# Patient Record
Sex: Female | Born: 1990 | Hispanic: Refuse to answer | Smoking: Never smoker
Health system: Southern US, Community
[De-identification: ages and names within clinical notes are randomized; demographics above are authoritative.]

## PROBLEM LIST (undated history)

## (undated) DIAGNOSIS — K219 Gastro-esophageal reflux disease without esophagitis: Secondary | ICD-10-CM

## (undated) DIAGNOSIS — Z789 Other specified health status: Secondary | ICD-10-CM

## (undated) HISTORY — PX: NO PAST SURGERIES: SHX2092

## (undated) HISTORY — DX: Other specified health status: Z78.9

---

## 2014-07-29 ENCOUNTER — Telehealth: Payer: Self-pay | Admitting: *Deleted

## 2014-07-29 NOTE — Telephone Encounter (Signed)
Patient is pregnant and requesting a NOB appointment. Patient states her LMP is 04-29-14.  Patient is currently [redacted] weeks pregnant. Patient has active Medicaid and has confirmed her pregnancy. Patient has been scheduled to see Orvilla Cornwallachelle Denney, CNM on 08-03-14 @ 2 pm. Patient advised if she has any emergent needs to utilize [facility]. Patient advised of No Show Policy and patient verbalized understanding.

## 2014-08-03 ENCOUNTER — Encounter: Payer: Self-pay | Admitting: Certified Nurse Midwife

## 2014-08-04 ENCOUNTER — Ambulatory Visit (INDEPENDENT_AMBULATORY_CARE_PROVIDER_SITE_OTHER): Payer: Medicaid Other | Admitting: Certified Nurse Midwife

## 2014-08-04 ENCOUNTER — Encounter: Payer: Self-pay | Admitting: Certified Nurse Midwife

## 2014-08-04 VITALS — BP 124/75 | HR 101 | Temp 98.9°F | Ht 63.5 in | Wt 140.0 lb

## 2014-08-04 DIAGNOSIS — Z3400 Encounter for supervision of normal first pregnancy, unspecified trimester: Secondary | ICD-10-CM

## 2014-08-04 DIAGNOSIS — Z3402 Encounter for supervision of normal first pregnancy, second trimester: Secondary | ICD-10-CM

## 2014-08-04 LAB — POCT URINALYSIS DIPSTICK
BILIRUBIN UA: NEGATIVE
GLUCOSE UA: NEGATIVE
Ketones, UA: NEGATIVE
NITRITE UA: NEGATIVE
PH UA: 7
Protein, UA: NEGATIVE
Spec Grav, UA: 1.01
Urobilinogen, UA: NEGATIVE

## 2014-08-04 NOTE — Addendum Note (Signed)
Addended by: Samantha CrimesENNEY, Jalynne Persico ANNE on: 08/04/2014 05:55 PM   Modules accepted: Orders

## 2014-08-04 NOTE — Progress Notes (Signed)
Subjective:    Tara RoughenRachel Vaquera is being seen today for her first obstetrical visit.  This is not a planned pregnancy. She is at 73103w6d gestation. Her obstetrical history is not significant for any risk factors. Relationship with FOB: significant other, not living together. Patient does intend to breast feed. Pregnancy history fully reviewed.  Unsure of LMP, "some time early November". Postpartum contraception discussed, unsure of what method she desires.     The information documented in the HPI was reviewed and verified.  Menstrual History: OB History    Gravida Para Term Preterm AB TAB SAB Ectopic Multiple Living   1               Menarche age: [age]  Patient's last menstrual period was 04/29/2014 (within days).    Past Medical History  Diagnosis Date  . Medical history non-contributory     History reviewed. No pertinent past surgical history.   (Not in a hospital admission) No Known Allergies  History  Substance Use Topics  . Smoking status: Never Smoker   . Smokeless tobacco: Never Used  . Alcohol Use: No    Family History  Problem Relation Age of Onset  . Hypertension Father   . Cancer Maternal Grandmother   . Cancer Maternal Grandfather   . Alzheimer's disease Paternal Grandmother   . Hypertension Paternal Grandfather      Review of Systems Constitutional: negative for weight loss Gastrointestinal: negative for vomiting Genitourinary:negative for genital lesions and vaginal discharge and dysuria Musculoskeletal:negative for back pain Behavioral/Psych: negative for abusive relationship, depression, illegal drug usage and tobacco use    Objective:    BP 124/75 mmHg  Pulse 101  Temp(Src) 98.9 F (37.2 C)  Ht 5' 3.5" (1.613 m)  Wt 63.504 kg (140 lb)  BMI 24.41 kg/m2  LMP 04/29/2014 (Within Days) General Appearance:    Alert, cooperative, no distress, appears stated age  Head:    Normocephalic, without obvious abnormality, atraumatic  Eyes:    PERRL,  conjunctiva/corneas clear, EOM's intact, fundi    benign, both eyes  Ears:    Normal TM's and external ear canals, both ears  Nose:   Nares normal, septum midline, mucosa normal, no drainage    or sinus tenderness  Throat:   Lips, mucosa, and tongue normal; teeth and gums normal  Neck:   Supple, symmetrical, trachea midline, no adenopathy;    thyroid:  no enlargement/tenderness/nodules; no carotid   bruit or JVD  Back:     Symmetric, no curvature, ROM normal, no CVA tenderness  Lungs:     Clear to auscultation bilaterally, respirations unlabored  Chest Wall:    No tenderness or deformity   Heart:    Regular rate and rhythm, S1 and S2 normal, no murmur, rub   or gallop  Breast Exam:    No tenderness, masses, or nipple abnormality  Abdomen:     Soft, non-tender, bowel sounds active all four quadrants,    no masses, no organomegaly  Genitalia:    Pelvic: cervix normal in appearance, external genitalia normal, no adnexal masses or tenderness, uterus about 14 week size, shape, and consistency and vagina normal white thick cottage cheese like discharge  Present.    Extremities:   Extremities normal, atraumatic, no cyanosis or edema  Pulses:   2+ and symmetric all extremities  Skin:   Skin color, texture, turgor normal, no rashes or lesions  Lymph nodes:   Cervical, supraclavicular, and axillary nodes normal  Neurologic:   CNII-XII  intact, normal strength, sensation and reflexes    throughout      Lab Review Urine pregnancy test Labs reviewed yes Radiologic studies reviewed not applicable  Assessment:    Pregnancy at [redacted]w[redacted]d weeks    Plan:    U/S ordered for confirmation of dates.    Prenatal vitamins.  Counseling provided regarding continued use of seat belts, cessation of alcohol consumption, smoking or use of illicit drugs; infection precautions i.e., influenza/TDAP immunizations, toxoplasmosis,CMV, parvovirus, listeria and varicella; workplace safety, exercise during pregnancy;  routine dental care, safe medications, sexual activity, hot tubs, saunas, pools, travel, caffeine use, fish and methlymercury, potential toxins, hair treatments, varicose veins Weight gain recommendations per IOM guidelines reviewed: underweight/BMI< 18.5--> gain 28 - 40 lbs; normal weight/BMI 18.5 - 24.9--> gain 25 - 35 lbs; overweight/BMI 25 - 29.9--> gain 15 - 25 lbs; obese/BMI >30->gain  11 - 20 lbs Problem list reviewed and updated. FIRST/CF mutation testing/NIPT/QUAD SCREEN/fragile X/Ashkenazi Jewish population testing/Spinal muscular atrophy discussed: requested. Role of ultrasound in pregnancy discussed; fetal survey: requested. Amniocentesis discussed: not indicated. VBAC calculator score: VBAC consent form provided Meds ordered this encounter  Medications  . Prenatal Multivit-Min-Fe-FA (PRENATAL VITAMINS PO)    Sig: Take 1 tablet by mouth.   Orders Placed This Encounter  Procedures  . Culture, OB Urine  . Obstetric panel  . HIV antibody  . Hemoglobinopathy evaluation  . Varicella zoster antibody, IgG  . Vit D  25 hydroxy (rtn osteoporosis monitoring)  . POCT urinalysis dipstick    Follow up in 4 weeks. 50% of 30 min visit spent on counseling and coordination of care.

## 2014-08-05 ENCOUNTER — Other Ambulatory Visit: Payer: Self-pay | Admitting: Certified Nurse Midwife

## 2014-08-05 DIAGNOSIS — O3680X1 Pregnancy with inconclusive fetal viability, fetus 1: Secondary | ICD-10-CM

## 2014-08-05 DIAGNOSIS — Z3687 Encounter for antenatal screening for uncertain dates: Secondary | ICD-10-CM

## 2014-08-05 LAB — PAP IG W/ RFLX HPV ASCU

## 2014-08-05 LAB — OBSTETRIC PANEL
ANTIBODY SCREEN: NEGATIVE
BASOS ABS: 0 10*3/uL (ref 0.0–0.1)
Basophils Relative: 0 % (ref 0–1)
Eosinophils Absolute: 0 10*3/uL (ref 0.0–0.7)
Eosinophils Relative: 0 % (ref 0–5)
HCT: 36.2 % (ref 36.0–46.0)
HEMOGLOBIN: 11.9 g/dL — AB (ref 12.0–15.0)
HEP B S AG: NEGATIVE
LYMPHS ABS: 1.8 10*3/uL (ref 0.7–4.0)
LYMPHS PCT: 15 % (ref 12–46)
MCH: 27.5 pg (ref 26.0–34.0)
MCHC: 32.9 g/dL (ref 30.0–36.0)
MCV: 83.6 fL (ref 78.0–100.0)
MONO ABS: 0.6 10*3/uL (ref 0.1–1.0)
MPV: 8.8 fL (ref 8.6–12.4)
Monocytes Relative: 5 % (ref 3–12)
NEUTROS PCT: 80 % — AB (ref 43–77)
Neutro Abs: 9.7 10*3/uL — ABNORMAL HIGH (ref 1.7–7.7)
Platelets: 398 10*3/uL (ref 150–400)
RBC: 4.33 MIL/uL (ref 3.87–5.11)
RDW: 17.3 % — ABNORMAL HIGH (ref 11.5–15.5)
RUBELLA: 12.7 {index} — AB (ref <0.90)
Rh Type: POSITIVE
WBC: 12.1 10*3/uL — ABNORMAL HIGH (ref 4.0–10.5)

## 2014-08-05 LAB — HIV ANTIBODY (ROUTINE TESTING W REFLEX): HIV 1&2 Ab, 4th Generation: NONREACTIVE

## 2014-08-05 LAB — VARICELLA ZOSTER ANTIBODY, IGG: Varicella IgG: 1845 Index — ABNORMAL HIGH (ref <135.00)

## 2014-08-05 LAB — VITAMIN D 25 HYDROXY (VIT D DEFICIENCY, FRACTURES): VIT D 25 HYDROXY: 17 ng/mL — AB (ref 30–100)

## 2014-08-06 LAB — CULTURE, OB URINE
Colony Count: NO GROWTH
ORGANISM ID, BACTERIA: NO GROWTH

## 2014-08-06 LAB — HEMOGLOBINOPATHY EVALUATION
HEMOGLOBIN OTHER: 0 %
HGB A2 QUANT: 2.3 % (ref 2.2–3.2)
HGB S QUANTITAION: 0 %
Hgb A: 97.3 % (ref 96.8–97.8)
Hgb F Quant: 0.4 % (ref 0.0–2.0)

## 2014-08-08 LAB — SURESWAB, VAGINOSIS/VAGINITIS PLUS
Atopobium vaginae: DETECTED Log (cells/mL)
BV CATEGORY: UNDETERMINED — AB
C. GLABRATA, DNA: NOT DETECTED
C. TRACHOMATIS RNA, TMA: DETECTED — AB
C. albicans, DNA: DETECTED — AB
C. parapsilosis, DNA: NOT DETECTED
C. tropicalis, DNA: NOT DETECTED
GARDNERELLA VAGINALIS: 7.8 Log (cells/mL)
LACTOBACILLUS SPECIES: 7.1 Log (cells/mL)
MEGASPHAERA SPECIES: NOT DETECTED Log (cells/mL)
N. gonorrhoeae RNA, TMA: NOT DETECTED
T. vaginalis RNA, QL TMA: NOT DETECTED

## 2014-08-10 ENCOUNTER — Other Ambulatory Visit: Payer: Self-pay | Admitting: *Deleted

## 2014-08-10 DIAGNOSIS — Z3400 Encounter for supervision of normal first pregnancy, unspecified trimester: Secondary | ICD-10-CM

## 2014-08-10 MED ORDER — OB COMPLETE PETITE 35-5-1-200 MG PO CAPS: 1.0000 | ORAL_CAPSULE | Freq: Every day | ORAL | Status: DC

## 2014-08-10 MED ORDER — TERCONAZOLE 0.4 % VA CREA
1.0000 | TOPICAL_CREAM | Freq: Every day | VAGINAL | Status: DC
Start: 2014-08-10 — End: 2014-09-01

## 2014-08-10 NOTE — Progress Notes (Signed)
Medications sent to pharmacy for low Vitamin D and yeast infection.  See lab note.

## 2014-08-12 ENCOUNTER — Ambulatory Visit (INDEPENDENT_AMBULATORY_CARE_PROVIDER_SITE_OTHER): Payer: Medicaid Other

## 2014-08-12 ENCOUNTER — Other Ambulatory Visit: Payer: Self-pay | Admitting: Certified Nurse Midwife

## 2014-08-12 DIAGNOSIS — Z36 Encounter for antenatal screening of mother: Secondary | ICD-10-CM

## 2014-08-12 DIAGNOSIS — Z3687 Encounter for antenatal screening for uncertain dates: Secondary | ICD-10-CM

## 2014-08-12 LAB — US OB COMP + 14 WK

## 2014-08-14 ENCOUNTER — Telehealth: Payer: Self-pay

## 2014-08-14 NOTE — Telephone Encounter (Signed)
Called patient's insurance [facility], they did say she did not have coverage, however this was just the prescription side;  they did not have the medical side phone number - tried to contact patient to get number on her card - called [place] [employer] medicaid at [phone] and they said patient has to get a letter of denial from her insurance and give to caseworker, so they can take it out of [place] [employer] - explained this to patient and she said she would take care of it. Ref # [id number] [facility]

## 2014-08-18 ENCOUNTER — Encounter: Payer: Self-pay | Admitting: *Deleted

## 2014-09-01 ENCOUNTER — Ambulatory Visit (INDEPENDENT_AMBULATORY_CARE_PROVIDER_SITE_OTHER): Payer: Medicaid Other | Admitting: Certified Nurse Midwife

## 2014-09-01 VITALS — BP 116/77 | HR 91 | Temp 98.4°F | Wt 144.0 lb

## 2014-09-01 DIAGNOSIS — N760 Acute vaginitis: Secondary | ICD-10-CM

## 2014-09-01 DIAGNOSIS — A5609 Other chlamydial infection of lower genitourinary tract: Secondary | ICD-10-CM

## 2014-09-01 DIAGNOSIS — Z3402 Encounter for supervision of normal first pregnancy, second trimester: Secondary | ICD-10-CM

## 2014-09-01 DIAGNOSIS — A499 Bacterial infection, unspecified: Secondary | ICD-10-CM

## 2014-09-01 DIAGNOSIS — A749 Chlamydial infection, unspecified: Secondary | ICD-10-CM

## 2014-09-01 DIAGNOSIS — O98312 Other infections with a predominantly sexual mode of transmission complicating pregnancy, second trimester: Secondary | ICD-10-CM

## 2014-09-01 DIAGNOSIS — B9689 Other specified bacterial agents as the cause of diseases classified elsewhere: Secondary | ICD-10-CM

## 2014-09-01 DIAGNOSIS — O98812 Other maternal infectious and parasitic diseases complicating pregnancy, second trimester: Secondary | ICD-10-CM

## 2014-09-01 LAB — POCT URINALYSIS DIPSTICK
Bilirubin, UA: NEGATIVE
GLUCOSE UA: NEGATIVE
KETONES UA: NEGATIVE
Leukocytes, UA: NEGATIVE
Nitrite, UA: NEGATIVE
Protein, UA: NEGATIVE
RBC UA: NEGATIVE
Urobilinogen, UA: NEGATIVE
pH, UA: 8.5

## 2014-09-01 MED ORDER — AZITHROMYCIN 250 MG PO TABS: ORAL_TABLET | ORAL | Status: AC

## 2014-09-01 MED ORDER — METRONIDAZOLE 0.75 % VA GEL: 1.0000 | Freq: Every day | VAGINAL | Status: AC

## 2014-09-01 NOTE — Progress Notes (Signed)
Subjective:    Tara RoughenRachel Strickland is a 24 y.o. female being seen today for her obstetrical visit. She is at 6866w2d gestation. Patient reports: no complaints . Fetal movement: normal.  Discussed importance of treatment of Chlamydia, abstinence until treatment for both her and her partner. Patient verbalized understanding.  Discussed OGTT after next visit. U/S ordered for fetal anatomy scan.  Discussed S/S PTL.   Problem List Items Addressed This Visit      Genitourinary   Chlamydial cervicitis   Bacterial vaginosis    Other Visit Diagnoses    Encounter for supervision of normal first pregnancy in second trimester    -  Primary    Relevant Orders    POCT urinalysis dipstick (Completed)    US OB Detail + 14 Wk    BV (bacterial vaginosis)        Relevant Medications    metroNIDAZOLE (METROGEL VAGINAL) 0.75 % vaginal gel    Chlamydia infection affecting pregnancy in second trimester          Patient Active Problem List   Diagnosis Date Noted  . Chlamydial cervicitis 09/02/2014  . Bacterial vaginosis 09/02/2014  . Normal first pregnancy confirmed, antepartum 08/04/2014   Objective:    BP 116/77 mmHg  Pulse 91  Temp(Src) 98.4 F (36.9 C)  Wt 65.318 kg (144 lb)  LMP 04/29/2014 (Within Days) FHT: 153 BPM  Uterine Size: size equals dates and at U.      Assessment:    Pregnancy @ 3566w2d    Plan:    OBGCT: discussed. Signs and symptoms of preterm labor: discussed and handout given. Medications ordered and patient educated on how to take them.   Labs, problem list reviewed and updated 2 hr GTT planned Follow up in 4 weeks.

## 2014-09-01 NOTE — Patient Instructions (Addendum)
Preterm Labor Information Preterm labor is when labor starts at less than 37 weeks of pregnancy. The normal length of a pregnancy is 39 to 41 weeks. CAUSES Often, there is no identifiable underlying cause as to why a woman goes into preterm labor. One of the most common known causes of preterm labor is infection. Infections of the uterus, cervix, vagina, amniotic sac, bladder, kidney, or even the lungs (pneumonia) can cause labor to start. Other suspected causes of preterm labor include:   Urogenital infections, such as yeast infections and bacterial vaginosis.   Uterine abnormalities (uterine shape, uterine septum, fibroids, or bleeding from the placenta).   A cervix that has been operated on (it may fail to stay closed).   Malformations in the fetus.   Multiple gestations (twins, triplets, and so on).   Breakage of the amniotic sac.  RISK FACTORS  Having a previous history of preterm labor.   Having premature rupture of membranes (PROM).   Having a placenta that covers the opening of the cervix (placenta previa).   Having a placenta that separates from the uterus (placental abruption).   Having a cervix that is too weak to hold the fetus in the uterus (incompetent cervix).   Having too much fluid in the amniotic sac (polyhydramnios).   Taking illegal drugs or smoking while pregnant.   Not gaining enough weight while pregnant.   Being younger than 82 and older than 22 years old.   Having a low socioeconomic status.   Being African American. SYMPTOMS Signs and symptoms of preterm labor include:   Menstrual-like cramps, abdominal pain, or back pain.  Uterine contractions that are regular, as frequent as six in an hour, regardless of their intensity (may be mild or painful).  Contractions that start on the top of the uterus and spread down to the lower abdomen and back.   A sense of increased pelvic pressure.   A watery or bloody mucus discharge  that comes from the vagina.  TREATMENT Depending on the length of the pregnancy and other circumstances, your health care provider may suggest bed rest. If necessary, there are medicines that can be given to stop contractions and to mature the fetal lungs. If labor happens before 34 weeks of pregnancy, a prolonged hospital stay may be recommended. Treatment depends on the condition of both you and the fetus.  WHAT SHOULD YOU DO IF YOU THINK YOU ARE IN PRETERM LABOR? Call your health care provider right away. You will need to go to the hospital to get checked immediately. HOW CAN YOU PREVENT PRETERM LABOR IN FUTURE PREGNANCIES? You should:   Stop smoking if you smoke.  Maintain healthy weight gain and avoid chemicals and drugs that are not necessary.  Be watchful for any type of infection.  Inform your health care provider if you have a known history of preterm labor. Document Released: 08/26/2003 Document Revised: 02/05/2013 Document Reviewed: 07/08/2012 [facility] Patient Information 2015 [place], [place]. This information is not intended to replace advice given to you by your health care provider. Make sure you discuss any questions you have with your health care provider. Preterm Labor Information Preterm labor is when labor starts at less than 37 weeks of pregnancy. The normal length of a pregnancy is 39 to 41 weeks. CAUSES Often, there is no identifiable underlying cause as to why a woman goes into preterm labor. One of the most common known causes of preterm labor is infection. Infections of the uterus, cervix, vagina, amniotic sac, bladder,  kidney, or even the lungs (pneumonia) can cause labor to start. Other suspected causes of preterm labor include:   Urogenital infections, such as yeast infections and bacterial vaginosis.   Uterine abnormalities (uterine shape, uterine septum, fibroids, or bleeding from the placenta).   A cervix that has been operated on (it may fail to stay  closed).   Malformations in the fetus.   Multiple gestations (twins, triplets, and so on).   Breakage of the amniotic sac.  RISK FACTORS  Having a previous history of preterm labor.   Having premature rupture of membranes (PROM).   Having a placenta that covers the opening of the cervix (placenta previa).   Having a placenta that separates from the uterus (placental abruption).   Having a cervix that is too weak to hold the fetus in the uterus (incompetent cervix).   Having too much fluid in the amniotic sac (polyhydramnios).   Taking illegal drugs or smoking while pregnant.   Not gaining enough weight while pregnant.   Being younger than 69 and older than 22 years old.   Having a low socioeconomic status.   Being African American. SYMPTOMS Signs and symptoms of preterm labor include:   Menstrual-like cramps, abdominal pain, or back pain.  Uterine contractions that are regular, as frequent as six in an hour, regardless of their intensity (may be mild or painful).  Contractions that start on the top of the uterus and spread down to the lower abdomen and back.   A sense of increased pelvic pressure.   A watery or bloody mucus discharge that comes from the vagina.  TREATMENT Depending on the length of the pregnancy and other circumstances, your health care provider may suggest bed rest. If necessary, there are medicines that can be given to stop contractions and to mature the fetal lungs. If labor happens before 34 weeks of pregnancy, a prolonged hospital stay may be recommended. Treatment depends on the condition of both you and the fetus.  WHAT SHOULD YOU DO IF YOU THINK YOU ARE IN PRETERM LABOR? Call your health care provider right away. You will need to go to the hospital to get checked immediately. HOW CAN YOU PREVENT PRETERM LABOR IN FUTURE PREGNANCIES? You should:   Stop smoking if you smoke.  Maintain healthy weight gain and avoid chemicals  and drugs that are not necessary.  Be watchful for any type of infection.  Inform your health care provider if you have a known history of preterm labor. Document Released: 08/26/2003 Document Revised: 02/05/2013 Document Reviewed: 07/08/2012 [facility] Patient Information 2015 [place], [place]. This information is not intended to replace advice given to you by your health care provider. Make sure you discuss any questions you have with your health care provider. Chlamydia Chlamydia is an infection. It is spread through sexual contact. Chlamydia can be in different areas of the body. These areas include the cervix, urethra, throat, or rectum. You may not know you have chlamydia because many people never develop the symptoms. Chlamydia is not difficult to treat once you know you have it. However, if it is left untreated, chlamydia can lead to more serious health problems.  CAUSES  Chlamydia is caused by bacteria. It is a sexually transmitted disease. It is passed from an infected partner during intimate contact. This contact could be with the genitals, mouth, or rectal area. Chlamydia can also be passed from mothers to babies during birth. SIGNS AND SYMPTOMS  There may not be any symptoms. This is often the  case early in the infection. If symptoms develop, they may include:  Mild pain and discomfort when urinating.  Redness, soreness, and swelling (inflammation) of the rectum.  Vaginal discharge.  Painful intercourse.  Abdominal pain.  Bleeding between menstrual periods. DIAGNOSIS  To diagnose this infection, your health care provider will do a pelvic exam. Cultures will be taken of the vagina, cervix, urine, and possibly the rectum to verify the diagnosis.  TREATMENT You will be given antibiotic medicines. If you are pregnant, certain types of antibiotics will need to be avoided. Any sexual partners should also be treated, even if they do not show symptoms.  HOME CARE INSTRUCTIONS   Take  your antibiotic medicine as directed by your health care provider. Finish the antibiotic even if you start to feel better.  Take medicines only as directed by your health care provider.  Inform any sexual partners about the infection. They should also be treated.  Do not have sexual contact until your health care provider tells you it is okay.  Get plenty of rest.  Eat a well-balanced diet.  Drink enough fluids to keep your urine clear or pale yellow.  Keep all follow-up visits as directed by your health care provider. SEEK MEDICAL CARE IF:  You have painful urination.  You have abdominal pain.  You have vaginal discharge.  You have painful sexual intercourse.  You have bleeding between periods and after sex.  You have a fever. SEEK IMMEDIATE MEDICAL CARE IF:   You experience nausea or vomiting.  You experience excessive sweating (diaphoresis).  You have difficulty swallowing. MAKE SURE YOU:   Understand these instructions.  Will watch your condition.  Will get help right away if you are not doing well or get worse. Document Released: 03/15/2005 Document Revised: 10/20/2013 Document Reviewed: 02/10/2013 [facility] Patient Information 2015 [place], [place]. This information is not intended to replace advice given to you by your health care provider. Make sure you discuss any questions you have with your health care provider. Bacterial Vaginosis Bacterial vaginosis is an infection of the vagina. It happens when too many of certain germs (bacteria) grow in the vagina. HOME CARE  Take your medicine as told by your doctor.  Finish your medicine even if you start to feel better.  Do not have sex until you finish your medicine and are better.  Tell your sex partner that you have an infection. They should see their doctor for treatment.  Practice safe sex. Use condoms. Have only one sex partner. GET HELP IF:  You are not getting better after 3 days of treatment.  You  have more grey fluid (discharge) coming from your vagina than before.  You have more pain than before.  You have a fever. MAKE SURE YOU:   Understand these instructions.  Will watch your condition.  Will get help right away if you are not doing well or get worse. Document Released: 03/14/2008 Document Revised: 03/26/2013 Document Reviewed: 01/15/2013 [facility] Patient Information 2015 [place], [place]. This information is not intended to replace advice given to you by your health care provider. Make sure you discuss any questions you have with your health care provider.

## 2014-09-02 ENCOUNTER — Other Ambulatory Visit: Payer: Self-pay | Admitting: Certified Nurse Midwife

## 2014-09-02 DIAGNOSIS — B9689 Other specified bacterial agents as the cause of diseases classified elsewhere: Secondary | ICD-10-CM | POA: Insufficient documentation

## 2014-09-02 DIAGNOSIS — N760 Acute vaginitis: Secondary | ICD-10-CM

## 2014-09-02 DIAGNOSIS — Z1389 Encounter for screening for other disorder: Secondary | ICD-10-CM

## 2014-09-02 DIAGNOSIS — A5609 Other chlamydial infection of lower genitourinary tract: Secondary | ICD-10-CM | POA: Insufficient documentation

## 2014-09-04 LAB — AFP, QUAD SCREEN
AFP: 57.9 ng/mL
Age Alone: 1:1140 {titer}
Curr Gest Age: 16.5 wks.days
HCG, Total: 6.81 IU/mL
INH: 108.7 pg/mL
INTERPRETATION-AFP: NEGATIVE
MOM FOR HCG: 0.19
MOM FOR INH: 0.61
MoM for AFP: 1.56
OPEN SPINA BIFIDA: NEGATIVE
Osb Risk: 1:2340 {titer}
Tri 18 Scr Risk Est: NEGATIVE
Trisomy 18 (Edward) Syndrome Interp.: 1:3060 {titer}
UE3 MOM: 1.74
uE3 Value: 1.72 ng/mL

## 2014-09-08 ENCOUNTER — Telehealth: Payer: Self-pay

## 2014-09-08 ENCOUNTER — Other Ambulatory Visit: Payer: Medicaid Other

## 2014-09-08 NOTE — Telephone Encounter (Signed)
DUE TO THE FACT [place] [employer] IS SHOWING PATIENT HAS ANOTHER INSURANCE, BESIDES MEDICAID, WE WERE NOT ABLE TO GET AUTH FOR [facility] - [facility]

## 2014-09-17 ENCOUNTER — Ambulatory Visit (HOSPITAL_COMMUNITY)
Admission: RE | Admit: 2014-09-17 | Discharge: 2014-09-17 | Disposition: A | Discharge status: Home / Self Care | Payer: Medicaid Other | Source: Ambulatory Visit | Attending: Certified Nurse Midwife | Admitting: Certified Nurse Midwife

## 2014-09-17 ENCOUNTER — Ambulatory Visit (HOSPITAL_COMMUNITY): Payer: Self-pay

## 2014-09-17 ENCOUNTER — Telehealth: Payer: Self-pay | Admitting: *Deleted

## 2014-09-17 DIAGNOSIS — Z36 Encounter for antenatal screening of mother: Secondary | ICD-10-CM | POA: Diagnosis present

## 2014-09-17 DIAGNOSIS — Z3A22 22 weeks gestation of pregnancy: Secondary | ICD-10-CM | POA: Diagnosis not present

## 2014-09-17 DIAGNOSIS — Z1389 Encounter for screening for other disorder: Secondary | ICD-10-CM | POA: Insufficient documentation

## 2014-09-17 NOTE — Telephone Encounter (Signed)
Call from Health dept for verification of treatment. 3/31 11:00 LM on VM - patient in office and treated with 1 gram dose Azithromycin on 3/15.

## 2014-09-22 ENCOUNTER — Other Ambulatory Visit: Payer: Medicaid Other

## 2014-09-29 ENCOUNTER — Ambulatory Visit (INDEPENDENT_AMBULATORY_CARE_PROVIDER_SITE_OTHER): Payer: Medicaid Other | Admitting: Certified Nurse Midwife

## 2014-09-29 ENCOUNTER — Encounter: Payer: Self-pay | Admitting: Certified Nurse Midwife

## 2014-09-29 VITALS — BP 114/74 | HR 93 | Temp 98.8°F | Wt 151.0 lb

## 2014-09-29 DIAGNOSIS — Z3402 Encounter for supervision of normal first pregnancy, second trimester: Secondary | ICD-10-CM

## 2014-09-29 LAB — POCT URINALYSIS DIPSTICK
Blood, UA: NEGATIVE
Glucose, UA: NEGATIVE
Ketones, UA: NEGATIVE
Leukocytes, UA: NEGATIVE
NITRITE UA: NEGATIVE
PH UA: 8
PROTEIN UA: NEGATIVE
Spec Grav, UA: <=1.005

## 2014-09-29 NOTE — Patient Instructions (Signed)
Preterm Labor Information Preterm labor is when labor starts at less than 37 weeks of pregnancy. The normal length of a pregnancy is 39 to 41 weeks. CAUSES Often, there is no identifiable underlying cause as to why a woman goes into preterm labor. One of the most common known causes of preterm labor is infection. Infections of the uterus, cervix, vagina, amniotic sac, bladder, kidney, or even the lungs (pneumonia) can cause labor to start. Other suspected causes of preterm labor include:   Urogenital infections, such as yeast infections and bacterial vaginosis.   Uterine abnormalities (uterine shape, uterine septum, fibroids, or bleeding from the placenta).   A cervix that has been operated on (it may fail to stay closed).   Malformations in the fetus.   Multiple gestations (twins, triplets, and so on).   Breakage of the amniotic sac.  RISK FACTORS  Having a previous history of preterm labor.   Having premature rupture of membranes (PROM).   Having a placenta that covers the opening of the cervix (placenta previa).   Having a placenta that separates from the uterus (placental abruption).   Having a cervix that is too weak to hold the fetus in the uterus (incompetent cervix).   Having too much fluid in the amniotic sac (polyhydramnios).   Taking illegal drugs or smoking while pregnant.   Not gaining enough weight while pregnant.   Being younger than 65 and older than 22 years old.   Having a low socioeconomic status.   Being African American. SYMPTOMS Signs and symptoms of preterm labor include:   Menstrual-like cramps, abdominal pain, or back pain.  Uterine contractions that are regular, as frequent as six in an hour, regardless of their intensity (may be mild or painful).  Contractions that start on the top of the uterus and spread down to the lower abdomen and back.   A sense of increased pelvic pressure.   A watery or bloody mucus discharge that  comes from the vagina.  TREATMENT Depending on the length of the pregnancy and other circumstances, your health care provider may suggest bed rest. If necessary, there are medicines that can be given to stop contractions and to mature the fetal lungs. If labor happens before 34 weeks of pregnancy, a prolonged hospital stay may be recommended. Treatment depends on the condition of both you and the fetus.  WHAT SHOULD YOU DO IF YOU THINK YOU ARE IN PRETERM LABOR? Call your health care provider right away. You will need to go to the hospital to get checked immediately. HOW CAN YOU PREVENT PRETERM LABOR IN FUTURE PREGNANCIES? You should:   Stop smoking if you smoke.  Maintain healthy weight gain and avoid chemicals and drugs that are not necessary.  Be watchful for any type of infection.  Inform your health care provider if you have a known history of preterm labor. Document Released: 08/26/2003 Document Revised: 02/05/2013 Document Reviewed: 07/08/2012 [facility] Patient Information 2015 [place], [place]. This information is not intended to replace advice given to you by your health care provider. Make sure you discuss any questions you have with your health care provider. Thinking About Doren Custard???  You must attend a Doren Custard class at [facility]  3rd Wednesday of every month from 7-9pm  [employer] by calling [phone] or online at [email or web address]  Bring [place] the certificate from the class   Waterbirth supplies needed for [facility]/[place]/Health Department patients:  Our practice has a Heritage manager in a Box tub at the hospital  that you can borrow  You will need to purchase an accessory kit that has all needed supplies through [place] [phone]) or online $175.00  Or you can purchase the supplies separately: o Single-use disposable tub liner for Birth Pool in a Box (REGULAR size) o New garden hose labeled "lead-free",  "suitable for drinking water", "non-toxic" OR "water potable" o Garden hose to remove the dirty water o Fish net o Bathing suit top (optional) o Long-handled mirror (optional)  [employer] sells tubs for ~ $120 if you would rather purchase your own tub  [employer] (www.thelaborladies.com) $275 for tub rental/set-up & take down/kit    Things that would prevent you from having a waterbirth:  Premature, <37wks  Previous cesarean birth  Presence of thick meconium-stained fluid  Multiple gestation (Twins, triplets, etc.)  Uncontrolled diabetes  Hypertension  Heavy vaginal bleeding  Non-reassuring fetal heart rate  Active infection (MRSA, etc.)  If your labor has to be induced and induction method requires continuous monitoring of the baby's heart rate  Other risks/issues identified by your obstetrical provider

## 2014-09-29 NOTE — Progress Notes (Signed)
Subjective:    Tara RoughenRachel Strickland is a 24 y.o. female being seen today for her obstetrical visit. She is at 4034w0d gestation. Patient reports: no complaints . Fetal movement: normal.  Problem List Items Addressed This Visit    None    Visit Diagnoses    Supervision of normal first pregnancy in second trimester    -  Primary    Relevant Orders    POCT urinalysis dipstick (Completed)      Patient Active Problem List   Diagnosis Date Noted  . Encounter for routine screening for malformation using ultrasonics   . [redacted] weeks gestation of pregnancy   . Chlamydial cervicitis 09/02/2014  . Bacterial vaginosis 09/02/2014  . Normal first pregnancy confirmed, antepartum 08/04/2014   Objective:    BP 114/74 mmHg  Pulse 93  Temp(Src) 98.8 F (37.1 C)  Wt 68.493 kg (151 lb)  LMP 04/29/2014 (Within Days) FHT: 150's BPM  Uterine Size: 24 cm and size equals dates     Assessment:    Pregnancy @ 1934w0d    Doing well.   Plan:    OBGCT: ordered for next visit. Signs and symptoms of preterm labor: discussed. Considering waterbirth, information given  Labs, problem list reviewed and updated 2 hr GTT planned Follow up in 4 weeks with OGTT.

## 2014-09-29 NOTE — Progress Notes (Signed)
Patient reports she is doing well today. 

## 2014-09-29 NOTE — Addendum Note (Signed)
Addended by: Marya LandryFOSTER, Donnita Farina D on: 09/29/2014 01:48 PM   Modules accepted: Orders

## 2014-10-02 LAB — SURESWAB, VAGINOSIS/VAGINITIS PLUS
Atopobium vaginae: NOT DETECTED Log (cells/mL)
C. PARAPSILOSIS, DNA: NOT DETECTED
C. TROPICALIS, DNA: NOT DETECTED
C. albicans, DNA: NOT DETECTED
C. glabrata, DNA: NOT DETECTED
C. trachomatis RNA, TMA: NOT DETECTED
Gardnerella vaginalis: NOT DETECTED Log (cells/mL)
LACTOBACILLUS SPECIES: >8 Log (cells/mL)
MEGASPHAERA SPECIES: NOT DETECTED Log (cells/mL)
N. gonorrhoeae RNA, TMA: NOT DETECTED
T. VAGINALIS RNA, QL TMA: NOT DETECTED

## 2014-10-27 ENCOUNTER — Other Ambulatory Visit: Payer: Medicaid Other

## 2014-10-27 ENCOUNTER — Ambulatory Visit (INDEPENDENT_AMBULATORY_CARE_PROVIDER_SITE_OTHER): Payer: Medicaid Other | Admitting: Certified Nurse Midwife

## 2014-10-27 VITALS — BP 112/69 | HR 81 | Temp 98.5°F | Wt 157.0 lb

## 2014-10-27 DIAGNOSIS — Z3402 Encounter for supervision of normal first pregnancy, second trimester: Secondary | ICD-10-CM | POA: Diagnosis not present

## 2014-10-27 DIAGNOSIS — O2690 Pregnancy related conditions, unspecified, unspecified trimester: Secondary | ICD-10-CM | POA: Diagnosis not present

## 2014-10-27 DIAGNOSIS — O219 Vomiting of pregnancy, unspecified: Secondary | ICD-10-CM

## 2014-10-27 LAB — CBC
HEMATOCRIT: 33.8 % — AB (ref 36.0–46.0)
HEMOGLOBIN: 11 g/dL — AB (ref 12.0–15.0)
MCH: 27.7 pg (ref 26.0–34.0)
MCHC: 32.5 g/dL (ref 30.0–36.0)
MCV: 85.1 fL (ref 78.0–100.0)
MPV: 8.4 fL — AB (ref 8.6–12.4)
Platelets: 344 10*3/uL (ref 150–400)
RBC: 3.97 MIL/uL (ref 3.87–5.11)
RDW: 14.6 % (ref 11.5–15.5)
WBC: 12.3 10*3/uL — AB (ref 4.0–10.5)

## 2014-10-27 LAB — POCT URINALYSIS DIPSTICK
Bilirubin, UA: NEGATIVE
Blood, UA: NEGATIVE
GLUCOSE UA: NEGATIVE
KETONES UA: NEGATIVE
LEUKOCYTES UA: NEGATIVE
Nitrite, UA: NEGATIVE
Protein, UA: NEGATIVE
Spec Grav, UA: 1.01
UROBILINOGEN UA: NEGATIVE
pH, UA: 7

## 2014-10-27 MED ORDER — ONDANSETRON HCL 4 MG PO TABS: 4.0000 mg | ORAL_TABLET | Freq: Three times a day (TID) | ORAL | Status: DC | PRN

## 2014-10-27 MED ORDER — TETANUS-DIPHTH-ACELL PERTUSSIS 5-2.5-18.5 LF-MCG/0.5 IM SUSP
0.5000 mL | Freq: Once | INTRAMUSCULAR | Status: AC
  Administered 2014-10-27: 0.5 mL via INTRAMUSCULAR

## 2014-10-27 NOTE — Progress Notes (Signed)
Pt received Tdap vaccine at today's visit.  Pt tolerated injection well.  Administrations This Visit    Tdap (BOOSTRIX) injection 0.5 mL    Admin Date Action Dose Route Administered By         10/27/2014 Given 0.5 mL Intramuscular Lanney GinsSuzanne D Kayona Foor, CMA

## 2014-10-27 NOTE — Patient Instructions (Signed)
Third Trimester of Pregnancy The third trimester is from week 29 through week 42, months 7 through 9. The third trimester is a time when the fetus is growing rapidly. At the end of the ninth month, the fetus is about 20 inches in length and weighs 6-10 pounds.  BODY CHANGES Your body goes through many changes during pregnancy. The changes vary from woman to woman.   Your weight will continue to increase. You can expect to gain 25-35 pounds (11-16 kg) by the end of the pregnancy.  You may begin to get stretch marks on your hips, abdomen, and breasts.  You may urinate more often because the fetus is moving lower into your pelvis and pressing on your bladder.  You may develop or continue to have heartburn as a result of your pregnancy.  You may develop constipation because certain hormones are causing the muscles that push waste through your intestines to slow down.  You may develop hemorrhoids or swollen, bulging veins (varicose veins).  You may have pelvic pain because of the weight gain and pregnancy hormones relaxing your joints between the bones in your pelvis. Backaches may result from overexertion of the muscles supporting your posture.  You may have changes in your hair. These can include thickening of your hair, rapid growth, and changes in texture. Some women also have hair loss during or after pregnancy, or hair that feels dry or thin. Your hair will most likely return to normal after your baby is born.  Your breasts will continue to grow and be tender. A yellow discharge may leak from your breasts called colostrum.  Your belly button may stick out.  You may feel short of breath because of your expanding uterus.  You may notice the fetus "dropping," or moving lower in your abdomen.  You may have a bloody mucus discharge. This usually occurs a few days to a week before labor begins.  Your cervix becomes thin and soft (effaced) near your due date. WHAT TO EXPECT AT YOUR PRENATAL  EXAMS  You will have prenatal exams every 2 weeks until week 36. Then, you will have weekly prenatal exams. During a routine prenatal visit:  You will be weighed to make sure you and the fetus are growing normally.  Your blood pressure is taken.  Your abdomen will be measured to track your baby's growth.  The fetal heartbeat will be listened to.  Any test results from the previous visit will be discussed.  You may have a cervical check near your due date to see if you have effaced. At around 36 weeks, your caregiver will check your cervix. At the same time, your caregiver will also perform a test on the secretions of the vaginal tissue. This test is to determine if a type of bacteria, Group B streptococcus, is present. Your caregiver will explain this further. Your caregiver may ask you:  What your birth plan is.  How you are feeling.  If you are feeling the baby move.  If you have had any abnormal symptoms, such as leaking fluid, bleeding, severe headaches, or abdominal cramping.  If you have any questions. Other tests or screenings that may be performed during your third trimester include:  Blood tests that check for low iron levels (anemia).  Fetal testing to check the health, activity level, and growth of the fetus. Testing is done if you have certain medical conditions or if there are problems during the pregnancy. FALSE LABOR You may feel small, irregular contractions that   eventually go away. These are called Braxton Hicks contractions, or false labor. Contractions may last for hours, days, or even weeks before true labor sets in. If contractions come at regular intervals, intensify, or become painful, it is best to be seen by your caregiver.  SIGNS OF LABOR   Menstrual-like cramps.  Contractions that are 5 minutes apart or less.  Contractions that start on the top of the uterus and spread down to the lower abdomen and back.  A sense of increased pelvic pressure or back  pain.  A watery or bloody mucus discharge that comes from the vagina. If you have any of these signs before the 37th week of pregnancy, call your caregiver right away. You need to go to the hospital to get checked immediately. HOME CARE INSTRUCTIONS   Avoid all smoking, herbs, alcohol, and unprescribed drugs. These chemicals affect the formation and growth of the baby.  Follow your caregiver's instructions regarding medicine use. There are medicines that are either safe or unsafe to take during pregnancy.  Exercise only as directed by your caregiver. Experiencing uterine cramps is a good sign to stop exercising.  Continue to eat regular, healthy meals.  Wear a good support bra for breast tenderness.  Do not use hot tubs, steam rooms, or saunas.  Wear your seat belt at all times when driving.  Avoid raw meat, uncooked cheese, cat litter boxes, and soil used by cats. These carry germs that can cause birth defects in the baby.  Take your prenatal vitamins.  Try taking a stool softener (if your caregiver approves) if you develop constipation. Eat more high-fiber foods, such as fresh vegetables or fruit and whole grains. Drink plenty of fluids to keep your urine clear or pale yellow.  Take warm sitz baths to soothe any pain or discomfort caused by hemorrhoids. Use hemorrhoid cream if your caregiver approves.  If you develop varicose veins, wear support hose. Elevate your feet for 15 minutes, 3-4 times a day. Limit salt in your diet.  Avoid heavy lifting, wear low heal shoes, and practice good posture.  Rest a lot with your legs elevated if you have leg cramps or low back pain.  Visit your dentist if you have not gone during your pregnancy. Use a soft toothbrush to brush your teeth and be gentle when you floss.  A sexual relationship may be continued unless your caregiver directs you otherwise.  Do not travel far distances unless it is absolutely necessary and only with the approval  of your caregiver.  Take prenatal classes to understand, practice, and ask questions about the labor and delivery.  Make a trial run to the hospital.  Pack your hospital bag.  Prepare the baby's nursery.  Continue to go to all your prenatal visits as directed by your caregiver. SEEK MEDICAL CARE IF:  You are unsure if you are in labor or if your water has broken.  You have dizziness.  You have mild pelvic cramps, pelvic pressure, or nagging pain in your abdominal area.  You have persistent nausea, vomiting, or diarrhea.  You have a bad smelling vaginal discharge.  You have pain with urination. SEEK IMMEDIATE MEDICAL CARE IF:   You have a fever.  You are leaking fluid from your vagina.  You have spotting or bleeding from your vagina.  You have severe abdominal cramping or pain.  You have rapid weight loss or gain.  You have shortness of breath with chest pain.  You notice sudden or extreme swelling   of your face, hands, ankles, feet, or legs.  You have not felt your baby move in over an hour.  You have severe headaches that do not go away with medicine.  You have vision changes. Document Released: 05/30/2001 Document Revised: 06/10/2013 Document Reviewed: 08/06/2012 [employer] Patient Information 2015 [employer]. This information is not intended to replace advice given to you by your health care provider. Make sure you discuss any questions you have with your health care provider.  

## 2014-10-27 NOTE — Progress Notes (Signed)
Subjective:    Tara RoughenRachel Lites is a 24 y.o. female being seen today for her obstetrical visit. She is at 6132w0d gestation. Patient reports nausea, no bleeding, no contractions, no cramping, no leaking, vomiting and emesis about 2 days ago.  . Fetal movement: normal.  Problem List Items Addressed This Visit    None    Visit Diagnoses    Encounter for supervision of normal first pregnancy in second trimester    -  Primary    Relevant Medications    Tdap (BOOSTRIX) injection 0.5 mL (Completed)    Other Relevant Orders    POCT urinalysis dipstick (Completed)    Glucose Tolerance, 2 Hours w/1 Hour    CBC    HIV antibody    RPR    Nausea and vomiting during pregnancy        Relevant Medications    ondansetron (ZOFRAN) 4 MG tablet      Patient Active Problem List   Diagnosis Date Noted  . Encounter for routine screening for malformation using ultrasonics   . [redacted] weeks gestation of pregnancy   . Chlamydial cervicitis 09/02/2014  . Bacterial vaginosis 09/02/2014  . Normal first pregnancy confirmed, antepartum 08/04/2014   Objective:    BP 112/69 mmHg  Pulse 81  Temp(Src) 98.5 F (36.9 C)  Wt 71.215 kg (157 lb)  LMP 04/29/2014 (Within Days) FHT:  145 BPM  Uterine Size: 28 cm and size equals dates  Presentation: unsure     Assessment:    Pregnancy @ 532w0d weeks   Plan:     labs reviewed, problem list updated TDAP today  Encouraged birthing, breastfeeding and water birth classes, she is undecided on water birth at this time.   Rhogam given for RH negative Pediatrician: discussed. Infant feeding: plans to breastfeed. Maternity leave: discussed. Cigarette smoking: never smoked.  Orders Placed This Encounter  Procedures  . Glucose Tolerance, 2 Hours w/1 Hour  . CBC  . HIV antibody  . RPR  . POCT urinalysis dipstick   Meds ordered this encounter  Medications  . ondansetron (ZOFRAN) 4 MG tablet    Sig: Take 1 tablet (4 mg total) by mouth every 8 (eight) hours as  needed for nausea or vomiting.    Dispense:  30 tablet    Refill:  1  . Tdap (BOOSTRIX) injection 0.5 mL    Sig:    Follow up in 2 Weeks.

## 2014-10-28 LAB — GLUCOSE TOLERANCE, 2 HOURS W/ 1HR
GLUCOSE, FASTING: 69 mg/dL — AB (ref 70–99)
Glucose, 1 hour: 73 mg/dL (ref 70–170)
Glucose, 2 hour: 49 mg/dL — ABNORMAL LOW (ref 70–139)

## 2014-10-28 LAB — RPR

## 2014-10-28 LAB — HIV ANTIBODY (ROUTINE TESTING W REFLEX): HIV 1&2 Ab, 4th Generation: NONREACTIVE

## 2014-11-10 ENCOUNTER — Ambulatory Visit (INDEPENDENT_AMBULATORY_CARE_PROVIDER_SITE_OTHER): Payer: Medicaid Other | Admitting: Certified Nurse Midwife

## 2014-11-10 VITALS — BP 129/78 | HR 99 | Temp 98.9°F | Wt 160.0 lb

## 2014-11-10 DIAGNOSIS — Z3403 Encounter for supervision of normal first pregnancy, third trimester: Secondary | ICD-10-CM

## 2014-11-10 LAB — POCT URINALYSIS DIPSTICK
BILIRUBIN UA: NEGATIVE
Blood, UA: NEGATIVE
Glucose, UA: NEGATIVE
KETONES UA: NEGATIVE
Nitrite, UA: NEGATIVE
PH UA: 5
Protein, UA: NEGATIVE
Spec Grav, UA: 1.02
UROBILINOGEN UA: NEGATIVE

## 2014-11-10 NOTE — Progress Notes (Signed)
Subjective:    Tara Strickland is a 24 y.o. female being seen today for her obstetrical visit. She is at 6352w0d gestation. Patient reports no bleeding, no contractions, no cramping and no leaking. Fetal movement: normal.  Problem List Items Addressed This Visit    None    Visit Diagnoses    Encounter for supervision of normal first pregnancy in third trimester    -  Primary    Relevant Orders    POCT urinalysis dipstick (Completed)      Patient Active Problem List   Diagnosis Date Noted  . Encounter for routine screening for malformation using ultrasonics   . [redacted] weeks gestation of pregnancy   . Chlamydial cervicitis 09/02/2014  . Bacterial vaginosis 09/02/2014  . Normal first pregnancy confirmed, antepartum 08/04/2014   Objective:    BP 129/78 mmHg  Pulse 99  Temp(Src) 98.9 F (37.2 C)  Wt 160 lb (72.576 kg)  LMP 04/29/2014 (Within Days) FHT:  150 BPM  Uterine Size: size equals dates  Presentation: cephalic     Assessment:    Pregnancy @ 8752w0d weeks   Plan:     labs reviewed, problem list updated Consent signed. GBS sent TDAP offered  Rhogam given for RH negative Pediatrician: discussed. Infant feeding: plans to breastfeed. Maternity leave: discussed. Cigarette smoking: never smoked. Orders Placed This Encounter  Procedures  . POCT urinalysis dipstick   No orders of the defined types were placed in this encounter.  encouraged classes.   Follow up in 2 Weeks.

## 2014-11-25 ENCOUNTER — Ambulatory Visit (INDEPENDENT_AMBULATORY_CARE_PROVIDER_SITE_OTHER): Payer: Medicaid Other | Admitting: Certified Nurse Midwife

## 2014-11-25 VITALS — BP 120/75 | HR 86 | Temp 98.6°F | Wt 167.0 lb

## 2014-11-25 DIAGNOSIS — Z3403 Encounter for supervision of normal first pregnancy, third trimester: Secondary | ICD-10-CM

## 2014-11-25 LAB — POCT URINALYSIS DIPSTICK
Bilirubin, UA: NEGATIVE
Blood, UA: NEGATIVE
Glucose, UA: NEGATIVE
KETONES UA: NEGATIVE
LEUKOCYTES UA: NEGATIVE
NITRITE UA: NEGATIVE
PH UA: 7
PROTEIN UA: NEGATIVE
Urobilinogen, UA: NEGATIVE

## 2014-11-25 NOTE — Progress Notes (Signed)
Subjective:    Cory RoughenRachel Sandiford is a 24 y.o. female being seen today for her obstetrical visit. She is at 5233w1d gestation. Patient reports no complaints. Fetal movement: normal.  Problem List Items Addressed This Visit    None    Visit Diagnoses    Encounter for supervision of normal first pregnancy in third trimester    -  Primary    Relevant Orders    POCT urinalysis dipstick (Completed)      Patient Active Problem List   Diagnosis Date Noted  . Encounter for routine screening for malformation using ultrasonics   . [redacted] weeks gestation of pregnancy   . Chlamydial cervicitis 09/02/2014  . Bacterial vaginosis 09/02/2014  . Normal first pregnancy confirmed, antepartum 08/04/2014   Objective:    BP 120/75 mmHg  Pulse 86  Temp(Src) 98.6 F (37 C)  Wt 167 lb (75.751 kg)  LMP 04/29/2014 (Within Days) FHT:  130's BPM  Uterine Size: size equals dates  Presentation: cephalic     Assessment:    Pregnancy @ 2533w1d weeks   Doing well.  Plan:     labs reviewed, problem list updated Encouraged prenatal classes/waterbirth class. TDAP offered  Pediatrician: discussed. Infant feeding: plans to breastfeed. Maternity leave: discussed, forms not required for her employer, plans to work until delivery. Cigarette smoking: never smoked. Orders Placed This Encounter  Procedures  . POCT urinalysis dipstick   No orders of the defined types were placed in this encounter.   Follow up in 2 Weeks.

## 2014-12-08 ENCOUNTER — Ambulatory Visit (INDEPENDENT_AMBULATORY_CARE_PROVIDER_SITE_OTHER): Payer: Medicaid Other | Admitting: Certified Nurse Midwife

## 2014-12-08 VITALS — BP 113/71 | HR 96 | Temp 97.9°F | Wt 167.0 lb

## 2014-12-08 DIAGNOSIS — Z3403 Encounter for supervision of normal first pregnancy, third trimester: Secondary | ICD-10-CM

## 2014-12-08 LAB — POCT URINALYSIS DIPSTICK
Bilirubin, UA: NEGATIVE
Blood, UA: NEGATIVE
GLUCOSE UA: NEGATIVE
KETONES UA: NEGATIVE
Nitrite, UA: NEGATIVE
PROTEIN UA: NEGATIVE
SPEC GRAV UA: 1.01
Urobilinogen, UA: NEGATIVE
pH, UA: 7.5

## 2014-12-08 NOTE — Progress Notes (Signed)
Subjective:    Tara Strickland is a 24 y.o. female being seen today for her obstetrical visit. She is at [redacted]w[redacted]d gestation. Patient reports no complaints. Fetal movement: normal.  Problem List Items Addressed This Visit    None    Visit Diagnoses    Encounter for supervision of normal first pregnancy in third trimester    -  Primary    Relevant Orders    POCT urinalysis dipstick (Completed)      Patient Active Problem List   Diagnosis Date Noted  . Encounter for routine screening for malformation using ultrasonics   . [redacted] weeks gestation of pregnancy   . Chlamydial cervicitis 09/02/2014  . Bacterial vaginosis 09/02/2014  . Normal first pregnancy confirmed, antepartum 08/04/2014   Objective:    BP 113/71 mmHg  Pulse 96  Temp(Src) 97.9 F (36.6 C)  Wt 167 lb (75.751 kg)  LMP 04/29/2014 (Within Days) FHT:  143 BPM  Uterine Size: size equals dates  Presentation: cephalic     Assessment:    Pregnancy @ [redacted]w[redacted]d weeks   Doing well.   Plan:     labs reviewed, problem list updated Consent signed. TDAP offered  Rhogam given for RH negative Pediatrician: discussed. Infant feeding: plans to breastfeed. Maternity leave: discussed. Cigarette smoking: never smoked. Orders Placed This Encounter  Procedures  . POCT urinalysis dipstick   No orders of the defined types were placed in this encounter.   Follow up in 2 Weeks with GBS swab.

## 2014-12-22 ENCOUNTER — Ambulatory Visit (INDEPENDENT_AMBULATORY_CARE_PROVIDER_SITE_OTHER): Payer: Medicaid Other | Admitting: Certified Nurse Midwife

## 2014-12-22 VITALS — BP 127/74 | HR 89 | Temp 98.6°F | Wt 172.0 lb

## 2014-12-22 DIAGNOSIS — Z3403 Encounter for supervision of normal first pregnancy, third trimester: Secondary | ICD-10-CM

## 2014-12-22 LAB — POCT URINALYSIS DIPSTICK
Bilirubin, UA: NEGATIVE
Glucose, UA: NEGATIVE
Ketones, UA: NEGATIVE
Leukocytes, UA: NEGATIVE
Nitrite, UA: NEGATIVE
PROTEIN UA: NEGATIVE
RBC UA: NEGATIVE
Spec Grav, UA: 1.01
UROBILINOGEN UA: NEGATIVE
pH, UA: 6

## 2014-12-22 NOTE — Progress Notes (Signed)
Subjective:    Tara Strickland is a 24 y.o. female being seen today for her obstetrical visit. She is at 3968w0d gestation. Patient reports no complaints. Fetal movement: normal.  Problem List Items Addressed This Visit    None    Visit Diagnoses    Encounter for supervision of normal first pregnancy in third trimester    -  Primary    Relevant Orders    POCT urinalysis dipstick    Strep B DNA probe      Patient Active Problem List   Diagnosis Date Noted  . Encounter for routine screening for malformation using ultrasonics   . [redacted] weeks gestation of pregnancy   . Chlamydial cervicitis 09/02/2014  . Bacterial vaginosis 09/02/2014  . Normal first pregnancy confirmed, antepartum 08/04/2014   Objective:    BP 127/74 mmHg  Pulse 89  Temp(Src) 98.6 F (37 C)  Wt 172 lb (78.019 kg)  LMP 04/29/2014 (Within Days) FHT:  143 BPM  Uterine Size: size equals dates  Presentation: cephalic   Cervix: long, closed, anterior, soft  Assessment:    Pregnancy @ 8068w0d weeks   Doing well  Plan:     labs reviewed, problem list updated Consent signed. GBS sent TDAP offered  Rhogam given for RH negative Pediatrician: discussed. Infant feeding: plans to breastfeed. Maternity leave: discussed. Cigarette smoking: never smoked. Orders Placed This Encounter  Procedures  . Strep B DNA probe  . POCT urinalysis dipstick   No orders of the defined types were placed in this encounter.   Follow up in 1 Week with Dr. Clearance CootsHarper.

## 2014-12-23 LAB — STREP B DNA PROBE: GBSP: NOT DETECTED

## 2014-12-29 ENCOUNTER — Ambulatory Visit (INDEPENDENT_AMBULATORY_CARE_PROVIDER_SITE_OTHER): Payer: Medicaid Other | Admitting: Certified Nurse Midwife

## 2014-12-29 VITALS — BP 118/75 | HR 96 | Temp 98.5°F | Wt 173.0 lb

## 2014-12-29 DIAGNOSIS — Z3403 Encounter for supervision of normal first pregnancy, third trimester: Secondary | ICD-10-CM

## 2014-12-29 LAB — POCT URINALYSIS DIPSTICK
Bilirubin, UA: NEGATIVE
Glucose, UA: NORMAL
Ketones, UA: NEGATIVE
Leukocytes, UA: NEGATIVE
Nitrite, UA: NEGATIVE
PH UA: 7
Protein, UA: NEGATIVE
SPEC GRAV UA: 1.01
Urobilinogen, UA: NEGATIVE

## 2014-12-29 NOTE — Progress Notes (Signed)
Subjective:    Tara RoughenRachel Strickland is a 24 y.o. female being seen today for her obstetrical visit. She is at 2573w0d gestation. Patient reports no complaints. Fetal movement: normal.  Problem List Items Addressed This Visit    None     Patient Active Problem List   Diagnosis Date Noted  . Encounter for routine screening for malformation using ultrasonics   . [redacted] weeks gestation of pregnancy   . Chlamydial cervicitis 09/02/2014  . Bacterial vaginosis 09/02/2014  . Normal first pregnancy confirmed, antepartum 08/04/2014    Objective:    BP 118/75 mmHg  Pulse 96  Temp(Src) 98.5 F (36.9 C)  Wt 173 lb (78.472 kg)  LMP 04/29/2014 (Within Days) FHT: 134 BPM  Uterine Size: 37 cm and size equals dates  Presentations: cephalic  Pelvic Exam: deferred     Assessment:    Pregnancy @ 8173w0d weeks   Doing well  Plan:   Plans for delivery: Vaginal anticipated; labs reviewed; problem list updated Counseling: Consent signed. Infant feeding: plans to breastfeed. Cigarette smoking: never smoked. L&D discussion: symptoms of labor, discussed when to call, discussed what number to call, anesthetic/analgesic options reviewed and delivering clinician:  plans Certified Nurse-Midwife. Postpartum supports and preparation: circumcision discussed and contraception plans discussed.  Follow up in 1 Week.

## 2014-12-29 NOTE — Addendum Note (Signed)
Addended by: Elby BeckPAUL, Mumtaz Lovins F on: 12/29/2014 11:40 AM   Modules accepted: Orders

## 2015-01-05 ENCOUNTER — Ambulatory Visit (INDEPENDENT_AMBULATORY_CARE_PROVIDER_SITE_OTHER): Payer: Medicaid Other | Admitting: Certified Nurse Midwife

## 2015-01-05 VITALS — BP 124/80 | HR 84 | Temp 98.0°F | Wt 174.0 lb

## 2015-01-05 DIAGNOSIS — Z3403 Encounter for supervision of normal first pregnancy, third trimester: Secondary | ICD-10-CM

## 2015-01-05 LAB — POCT URINALYSIS DIPSTICK
BILIRUBIN UA: NEGATIVE
Glucose, UA: NEGATIVE
KETONES UA: NEGATIVE
Leukocytes, UA: NEGATIVE
NITRITE UA: NEGATIVE
Protein, UA: NEGATIVE
UROBILINOGEN UA: NEGATIVE
pH, UA: 7

## 2015-01-05 NOTE — Progress Notes (Signed)
Subjective:    Tara Strickland is a 24 y.o. female being seen today for her obstetrical visit. She is at 8470w0d gestation. Patient reports backache, fatigue, no bleeding, no contractions, no cramping and no leaking. Fetal movement: normal.  Problem List Items Addressed This Visit    None     Patient Active Problem List   Diagnosis Date Noted  . Encounter for routine screening for malformation using ultrasonics   . [redacted] weeks gestation of pregnancy   . Chlamydial cervicitis 09/02/2014  . Bacterial vaginosis 09/02/2014  . Normal first pregnancy confirmed, antepartum 08/04/2014    Objective:    BP 124/80 mmHg  Pulse 84  Temp(Src) 98 F (36.7 C)  Wt 174 lb (78.926 kg)  LMP 04/29/2014 (Within Days) FHT: 153 BPM  Uterine Size: size equals dates  Presentations: cephalic  Pelvic Exam:              Dilation: Closed       Effacement: Long             Station:  Floating    Consistency: soft            Position: anterior     Assessment:    Pregnancy @ 3770w0d weeks   Plan:   Plans for delivery: Vaginal anticipated; labs reviewed; problem list updated Counseling: Consent signed. Infant feeding: plans to breastfeed. Cigarette smoking: never smoked. L&D discussion: symptoms of labor, discussed when to call, discussed what number to call, anesthetic/analgesic options reviewed and delivering clinician:  plans Certified Nurse-Midwife. Postpartum supports and preparation: circumcision discussed and contraception plans discussed.  Follow up in 1 Week with Dr. Clearance CootsHarper to meet him.

## 2015-01-12 ENCOUNTER — Encounter: Payer: Self-pay | Admitting: Obstetrics

## 2015-01-12 ENCOUNTER — Ambulatory Visit (INDEPENDENT_AMBULATORY_CARE_PROVIDER_SITE_OTHER): Payer: Medicaid Other | Admitting: Obstetrics

## 2015-01-12 VITALS — BP 117/76 | HR 92 | Temp 99.0°F | Wt 175.0 lb

## 2015-01-12 DIAGNOSIS — Z3403 Encounter for supervision of normal first pregnancy, third trimester: Secondary | ICD-10-CM

## 2015-01-12 LAB — POCT URINALYSIS DIPSTICK
BILIRUBIN UA: NEGATIVE
GLUCOSE UA: NEGATIVE
KETONES UA: NEGATIVE
Nitrite, UA: NEGATIVE
PH UA: 6.5
Spec Grav, UA: 1.015
Urobilinogen, UA: NEGATIVE

## 2015-01-12 NOTE — Progress Notes (Signed)
Subjective:    Tara Strickland is a 24 y.o. female being seen today for her obstetrical visit. She is at [redacted]w[redacted]d gestation. Patient reports no complaints. Fetal movement: normal.  Problem List Items Addressed This Visit    None    Visit Diagnoses    Encounter for supervision of normal first pregnancy in third trimester    -  Primary    Relevant Orders    POCT urinalysis dipstick (Completed)      Patient Active Problem List   Diagnosis Date Noted  . Encounter for routine screening for malformation using ultrasonics   . [redacted] weeks gestation of pregnancy   . Chlamydial cervicitis 09/02/2014  . Bacterial vaginosis 09/02/2014  . Normal first pregnancy confirmed, antepartum 08/04/2014    Objective:    BP 117/76 mmHg  Pulse 92  Temp(Src) 99 F (37.2 C)  Wt 175 lb (79.379 kg)  LMP 04/29/2014 (Within Days) FHT: 150 BPM  Uterine Size: size equals dates  Presentations: cephalic ( OP )  Pelvic Exam:              Dilation: Closed       Effacement: 50%             Station:  -3    Consistency: medium            Position: posterior     Assessment:    Pregnancy @ [redacted]w[redacted]d weeks   Plan:   Plans for delivery: Vaginal anticipated; labs reviewed; problem list updated Counseling: Consent signed. Infant feeding: plans to breastfeed. Cigarette smoking: never smoked. L&D discussion: symptoms of labor, discussed when to call, discussed what number to call, anesthetic/analgesic options reviewed and delivering clinician:  plans no preference. Postpartum supports and preparation: circumcision discussed and contraception plans discussed.  Follow up in 1 Week.

## 2015-01-18 ENCOUNTER — Ambulatory Visit (INDEPENDENT_AMBULATORY_CARE_PROVIDER_SITE_OTHER): Payer: Medicaid Other | Admitting: Obstetrics

## 2015-01-18 ENCOUNTER — Encounter: Payer: Self-pay | Admitting: Obstetrics

## 2015-01-18 VITALS — BP 127/83 | HR 120 | Temp 97.1°F | Wt 178.4 lb

## 2015-01-18 DIAGNOSIS — Z3483 Encounter for supervision of other normal pregnancy, third trimester: Secondary | ICD-10-CM

## 2015-01-18 LAB — POCT URINALYSIS DIPSTICK
BILIRUBIN UA: NEGATIVE
Blood, UA: NEGATIVE
GLUCOSE UA: NORMAL
Ketones, UA: NEGATIVE
LEUKOCYTES UA: NEGATIVE
Nitrite, UA: NEGATIVE
PH UA: 6
PROTEIN UA: NEGATIVE
SPEC GRAV UA: 1.01
UROBILINOGEN UA: NEGATIVE

## 2015-01-18 NOTE — Progress Notes (Signed)
Subjective:    Tara Strickland is a 24 y.o. female being seen today for her obstetrical visit. She is at [redacted]w[redacted]d gestation. Patient reports occasional contractions. Fetal movement: normal.  Problem List Items Addressed This Visit    None    Visit Diagnoses    Encounter for supervision of other normal pregnancy in third trimester    -  Primary    Relevant Orders    POCT urinalysis dipstick (Completed)      Patient Active Problem List   Diagnosis Date Noted  . Encounter for routine screening for malformation using ultrasonics   . [redacted] weeks gestation of pregnancy   . Chlamydial cervicitis 09/02/2014  . Bacterial vaginosis 09/02/2014  . Normal first pregnancy confirmed, antepartum 08/04/2014    Objective:    BP 127/83 mmHg  Pulse 120  Temp(Src) 97.1 F (36.2 C)  Wt 178 lb 6.4 oz (80.922 kg)  LMP 04/29/2014 (Within Days) FHT: 150 BPM  Uterine Size: size greater than dates  Presentations: cephalic  Pelvic Exam:              Dilation: Closed       Effacement: 50%             Station:  -3    Consistency: medium            Position: posterior     Assessment:    Pregnancy @ [redacted]w[redacted]d weeks   Plan:   Plans for delivery: Vaginal anticipated; labs reviewed; problem list updated Counseling: Consent signed. Infant feeding: plans to breastfeed. Cigarette smoking: never smoked. L&D discussion: symptoms of labor, discussed when to call, discussed what number to call, anesthetic/analgesic options reviewed and delivering clinician:  plans no preference. Postpartum supports and preparation: circumcision discussed and contraception plans discussed.  Follow up in 1 Week.

## 2015-01-19 ENCOUNTER — Inpatient Hospital Stay (HOSPITAL_COMMUNITY): Admission: AD | Admit: 2015-01-19 | Payer: Medicaid Other | Source: Ambulatory Visit | Admitting: Obstetrics

## 2015-01-22 ENCOUNTER — Encounter (HOSPITAL_COMMUNITY): Payer: Self-pay | Admitting: *Deleted

## 2015-01-22 ENCOUNTER — Telehealth (HOSPITAL_COMMUNITY): Payer: Self-pay | Admitting: *Deleted

## 2015-01-22 NOTE — Telephone Encounter (Signed)
Preadmission screen  

## 2015-01-25 ENCOUNTER — Encounter: Payer: Self-pay | Admitting: Obstetrics

## 2015-01-25 ENCOUNTER — Ambulatory Visit (INDEPENDENT_AMBULATORY_CARE_PROVIDER_SITE_OTHER): Payer: Medicaid Other | Admitting: Obstetrics

## 2015-01-25 VITALS — BP 136/70 | HR 88 | Temp 98.6°F | Wt 182.0 lb

## 2015-01-25 DIAGNOSIS — Z3403 Encounter for supervision of normal first pregnancy, third trimester: Secondary | ICD-10-CM

## 2015-01-25 LAB — POCT URINALYSIS DIPSTICK
BILIRUBIN UA: NEGATIVE
Bilirubin, UA: NEGATIVE
GLUCOSE UA: NEGATIVE
Glucose, UA: NORMAL
Ketones, UA: NEGATIVE
Ketones, UA: NEGATIVE
LEUKOCYTES UA: NEGATIVE
Leukocytes, UA: NEGATIVE
Nitrite, UA: NEGATIVE
Nitrite, UA: NEGATIVE
PH UA: 7
PROTEIN UA: NEGATIVE
PROTEIN UA: NEGATIVE
RBC UA: NEGATIVE
Spec Grav, UA: <=1.005
UROBILINOGEN UA: NEGATIVE
Urobilinogen, UA: NEGATIVE
pH, UA: 7

## 2015-01-25 NOTE — Progress Notes (Signed)
Subjective:    Tara Strickland is a 24 y.o. female being seen today for her obstetrical visit. She is at [redacted]w[redacted]d gestation. Patient reports occasional contractions. Fetal movement: normal.  Problem List Items Addressed This Visit    None    Visit Diagnoses    Encounter for supervision of normal first pregnancy in third trimester    -  Primary    Relevant Orders    POCT urinalysis dipstick (Completed)    POCT urinalysis dipstick (Completed)      Patient Active Problem List   Diagnosis Date Noted  . Encounter for routine screening for malformation using ultrasonics   . [redacted] weeks gestation of pregnancy   . Chlamydial cervicitis 09/02/2014  . Bacterial vaginosis 09/02/2014  . Normal first pregnancy confirmed, antepartum 08/04/2014    Objective:    BP 136/70 mmHg  Pulse 88  Temp(Src) 98.6 F (37 C)  Wt 182 lb (82.555 kg)  LMP 04/29/2014 (Within Days) FHT:  150 BPM  Uterine Size: size equals dates  Presentation: cephalic    Assessment:    Pregnancy @ [redacted]w[redacted]d  weeks   Plan:    Postdates management: discussed fetal surveillance and induction, discussed fetal movement, NST reactive, biophysical profile ordered. Induction: scheduled for IOL on 01-26-15, written information given.  Follow up  2 weeks postpartum.

## 2015-01-26 ENCOUNTER — Inpatient Hospital Stay (HOSPITAL_COMMUNITY): Payer: Medicaid Other

## 2015-01-27 ENCOUNTER — Encounter (HOSPITAL_COMMUNITY): Payer: Self-pay

## 2015-01-27 ENCOUNTER — Inpatient Hospital Stay (HOSPITAL_COMMUNITY)
Admission: RE | Admit: 2015-01-27 | Discharge: 2015-02-01 | DRG: 766 | Disposition: A | Discharge status: Home / Self Care | Payer: Medicaid Other | Source: Ambulatory Visit | Attending: Obstetrics | Admitting: Obstetrics

## 2015-01-27 DIAGNOSIS — Z3A41 41 weeks gestation of pregnancy: Secondary | ICD-10-CM | POA: Diagnosis present

## 2015-01-27 DIAGNOSIS — O480 Post-term pregnancy: Principal | ICD-10-CM | POA: Diagnosis present

## 2015-01-27 DIAGNOSIS — Z98891 History of uterine scar from previous surgery: Secondary | ICD-10-CM

## 2015-01-27 LAB — CBC
HCT: 32 % — ABNORMAL LOW (ref 36.0–46.0)
Hemoglobin: 10.4 g/dL — ABNORMAL LOW (ref 12.0–15.0)
MCH: 26.9 pg (ref 26.0–34.0)
MCHC: 32.5 g/dL (ref 30.0–36.0)
MCV: 82.9 fL (ref 78.0–100.0)
PLATELETS: 330 10*3/uL (ref 150–400)
RBC: 3.86 MIL/uL — AB (ref 3.87–5.11)
RDW: 15.5 % (ref 11.5–15.5)
WBC: 12.7 10*3/uL — AB (ref 4.0–10.5)

## 2015-01-27 LAB — ABO/RH: ABO/RH(D): B POS

## 2015-01-27 LAB — RPR: RPR Ser Ql: NONREACTIVE

## 2015-01-27 LAB — TYPE AND SCREEN
ABO/RH(D): B POS
ANTIBODY SCREEN: NEGATIVE

## 2015-01-27 MED ORDER — LACTATED RINGERS IV SOLN
INTRAVENOUS | Status: DC
  Administered 2015-01-27 (×4): via INTRAVENOUS

## 2015-01-27 MED ORDER — TERBUTALINE SULFATE 1 MG/ML IJ SOLN
0.2500 mg | Freq: Once | INTRAMUSCULAR | Status: DC | PRN
  Filled 2015-01-27: qty 1

## 2015-01-27 MED ORDER — OXYCODONE-ACETAMINOPHEN 5-325 MG PO TABS: 2.0000 | ORAL_TABLET | ORAL | Status: DC | PRN

## 2015-01-27 MED ORDER — CITRIC ACID-SODIUM CITRATE 334-500 MG/5ML PO SOLN
30.0000 mL | ORAL | Status: DC | PRN
  Administered 2015-01-29: 30 mL via ORAL
  Filled 2015-01-27: qty 15

## 2015-01-27 MED ORDER — FLEET ENEMA 7-19 GM/118ML RE ENEM: 1.0000 | ENEMA | RECTAL | Status: DC | PRN

## 2015-01-27 MED ORDER — OXYTOCIN 40 UNITS IN LACTATED RINGERS INFUSION - SIMPLE MED
1.0000 m[IU]/min | INTRAVENOUS | Status: DC
  Administered 2015-01-27: 2 m[IU]/min via INTRAVENOUS

## 2015-01-27 MED ORDER — OXYTOCIN BOLUS FROM INFUSION: 500.0000 mL | INTRAVENOUS | Status: DC

## 2015-01-27 MED ORDER — ZOLPIDEM TARTRATE 5 MG PO TABS
5.0000 mg | ORAL_TABLET | Freq: Every evening | ORAL | Status: DC | PRN
  Administered 2015-01-27: 5 mg via ORAL
  Filled 2015-01-27: qty 1

## 2015-01-27 MED ORDER — ACETAMINOPHEN 325 MG PO TABS: 650.0000 mg | ORAL_TABLET | ORAL | Status: DC | PRN

## 2015-01-27 MED ORDER — LACTATED RINGERS IV SOLN
500.0000 mL | INTRAVENOUS | Status: DC | PRN
  Administered 2015-01-28 – 2015-01-29 (×2): 500 mL via INTRAVENOUS

## 2015-01-27 MED ORDER — OXYTOCIN 40 UNITS IN LACTATED RINGERS INFUSION - SIMPLE MED
INTRAVENOUS | Status: AC
  Administered 2015-01-27: 2 m[IU]/min via INTRAVENOUS
  Filled 2015-01-27: qty 1000

## 2015-01-27 MED ORDER — MISOPROSTOL 200 MCG PO TABS
50.0000 ug | ORAL_TABLET | ORAL | Status: DC
  Administered 2015-01-27 – 2015-01-28 (×4): 50 ug via ORAL
  Filled 2015-01-27 (×4): qty 0.5

## 2015-01-27 MED ORDER — OXYTOCIN 40 UNITS IN LACTATED RINGERS INFUSION - SIMPLE MED
62.5000 mL/h | INTRAVENOUS | Status: DC
  Filled 2015-01-27: qty 1000

## 2015-01-27 MED ORDER — LIDOCAINE HCL (PF) 1 % IJ SOLN
30.0000 mL | INTRAMUSCULAR | Status: DC | PRN
  Filled 2015-01-27: qty 30

## 2015-01-27 MED ORDER — OXYCODONE-ACETAMINOPHEN 5-325 MG PO TABS
1.0000 | ORAL_TABLET | ORAL | Status: DC | PRN
  Administered 2015-01-29 – 2015-02-01 (×7): 1 via ORAL
  Filled 2015-01-27 (×8): qty 1

## 2015-01-27 MED ORDER — ONDANSETRON HCL 4 MG/2ML IJ SOLN: 4.0000 mg | Freq: Four times a day (QID) | INTRAMUSCULAR | Status: DC | PRN

## 2015-01-27 NOTE — H&P (Signed)
Morris Markham is a 24 y.o. female presenting for IOL for postdates. Maternal Medical History:  Fetal activity: Perceived fetal activity is normal.   Last perceived fetal movement was within the past hour.    Prenatal complications: no prenatal complications Prenatal Complications - Diabetes: none.    OB History    Gravida Para Term Preterm AB TAB SAB Ectopic Multiple Living   1              Past Medical History  Diagnosis Date  . Medical history non-contributory    Past Surgical History  Procedure Laterality Date  . No past surgeries     Family History: family history includes Alzheimer's disease in her paternal grandmother; Cancer in her maternal grandfather and maternal grandmother; Hypertension in her father and paternal grandfather. Social History:  reports that she has never smoked. She has never used smokeless tobacco. She reports that she does not drink alcohol or use illicit drugs.   Prenatal Transfer Tool  Maternal Diabetes: No Genetic Screening: Normal Maternal Ultrasounds/Referrals: Normal Fetal Ultrasounds or other Referrals:  None Maternal Substance Abuse:  No Significant Maternal Medications:  None Significant Maternal Lab Results:  None Other Comments:  None  Review of Systems  All other systems reviewed and are negative.   Dilation: 1 Effacement (%): Thick Exam by:: Susy Manor, RN  Blood pressure 118/75, pulse 78, temperature 97.5 F (36.4 C), temperature source Axillary, resp. rate 18, height  (1.626 m), weight 182 lb (82.555 kg), last menstrual period 04/29/2014. Maternal Exam:  Abdomen: Patient reports no abdominal tenderness. Cervix: Cervix evaluated by digital exam.     Physical Exam  Nursing note and vitals reviewed. Constitutional: She is oriented to person, place, and time. She appears well-developed and well-nourished.  HENT:  Head: Normocephalic and atraumatic.  Eyes: Conjunctivae are normal. Pupils are equal, round, and  reactive to light.  Neck: Normal range of motion.  Cardiovascular: Normal rate and regular rhythm.   Respiratory: Effort normal.  GI: Soft.  Musculoskeletal: Normal range of motion.  Neurological: She is alert and oriented to person, place, and time.  Skin: Skin is warm and dry.  Psychiatric: She has a normal mood and affect. Her behavior is normal. Judgment and thought content normal.    Prenatal labs: ABO, Rh: --/--/B POS, B POS (08/10 0100) Antibody: NEG (08/10 0100) Rubella: 12.70 (02/16 1443) RPR: NON REAC (05/10 1037)  HBsAg: NEGATIVE (02/16 1443)  HIV: NONREACTIVE (05/10 1037)  GBS: NOT DETECTED (07/05 1153)   Assessment/Plan: 41 weeks.  Postdates.  2 stage IOL.   HARPER,CHARLES A 01/27/2015, 4:18 AM

## 2015-01-27 NOTE — Progress Notes (Signed)
Tara Strickland is a 24 y.o. G1P0 at [redacted]w[redacted]d by LMP admitted for induction of labor due to Post dates. Due date 01-19-15.  Subjective:   Objective: BP 100/38 mmHg  Pulse 73  Temp(Src) 98.2 F (36.8 C) (Oral)  Resp 20  Ht  (1.626 m)  Wt 182 lb (82.555 kg)  BMI 31.22 kg/m2  LMP 04/29/2014 (Within Days)      FHT:  FHR: 145 bpm, variability: moderate,  accelerations:  Present,  decelerations:  Absent UC:   regular, every 3-6 minutes SVE:   Dilation: 3 Effacement (%): 50 Station: -3 Exam by:: Violeta Gelinas, RN  Labs: Lab Results  Component Value Date   WBC 12.7* 01/27/2015   HGB 10.4* 01/27/2015   HCT 32.0* 01/27/2015   MCV 82.9 01/27/2015   PLT 330 01/27/2015    Assessment / Plan: 41 weeks.  2 stage IOL.  Continue Cytotec.  Labor: Progressing normally Preeclampsia:  n/a Fetal Wellbeing:  Category I Pain Control:  Labor support without medications I/D:  n/a Anticipated MOD:  NSVD  HARPER,CHARLES A 01/27/2015, 10:59 PM

## 2015-01-28 ENCOUNTER — Inpatient Hospital Stay (HOSPITAL_COMMUNITY): Payer: Medicaid Other | Admitting: Anesthesiology

## 2015-01-28 MED ORDER — OXYTOCIN 40 UNITS IN LACTATED RINGERS INFUSION - SIMPLE MED
1.0000 m[IU]/min | INTRAVENOUS | Status: DC
  Administered 2015-01-28: 1 m[IU]/min via INTRAVENOUS

## 2015-01-28 MED ORDER — LIDOCAINE HCL (PF) 1 % IJ SOLN
INTRAMUSCULAR | Status: DC | PRN
  Administered 2015-01-28 (×2): 4 mL

## 2015-01-28 MED ORDER — DIPHENHYDRAMINE HCL 50 MG/ML IJ SOLN: 12.5000 mg | INTRAMUSCULAR | Status: DC | PRN

## 2015-01-28 MED ORDER — EPHEDRINE 5 MG/ML INJ: 10.0000 mg | INTRAVENOUS | Status: DC | PRN

## 2015-01-28 MED ORDER — PHENYLEPHRINE 40 MCG/ML (10ML) SYRINGE FOR IV PUSH (FOR BLOOD PRESSURE SUPPORT)
80.0000 ug | PREFILLED_SYRINGE | INTRAVENOUS | Status: DC | PRN
  Administered 2015-01-29: 80 ug via INTRAVENOUS
  Filled 2015-01-28: qty 20

## 2015-01-28 MED ORDER — NALBUPHINE HCL 10 MG/ML IJ SOLN
10.0000 mg | INTRAMUSCULAR | Status: DC | PRN
  Administered 2015-01-28 (×2): 10 mg via INTRAMUSCULAR
  Filled 2015-01-28 (×2): qty 1

## 2015-01-28 MED ORDER — TERBUTALINE SULFATE 1 MG/ML IJ SOLN: 0.2500 mg | Freq: Once | INTRAMUSCULAR | Status: DC | PRN

## 2015-01-28 MED ORDER — FENTANYL CITRATE (PF) 100 MCG/2ML IJ SOLN: 100.0000 ug | INTRAMUSCULAR | Status: DC | PRN

## 2015-01-28 MED ORDER — PROMETHAZINE HCL 25 MG/ML IJ SOLN
25.0000 mg | Freq: Four times a day (QID) | INTRAMUSCULAR | Status: DC | PRN
  Administered 2015-01-28: 25 mg via INTRAMUSCULAR
  Filled 2015-01-28: qty 1

## 2015-01-28 MED ORDER — NALBUPHINE HCL 10 MG/ML IJ SOLN
10.0000 mg | INTRAMUSCULAR | Status: DC | PRN
  Administered 2015-01-28 (×2): 10 mg via INTRAVENOUS
  Filled 2015-01-28 (×2): qty 1

## 2015-01-28 MED ORDER — FENTANYL 2.5 MCG/ML BUPIVACAINE 1/10 % EPIDURAL INFUSION (WH - ANES)
14.0000 mL/h | INTRAMUSCULAR | Status: DC | PRN
  Administered 2015-01-28 (×2): 14 mL/h via EPIDURAL
  Filled 2015-01-28: qty 125

## 2015-01-28 NOTE — Anesthesia Preprocedure Evaluation (Signed)
Anesthesia Evaluation  Patient identified by MRN, date of birth, ID band Patient awake    Reviewed: Allergy & Precautions, NPO status , Patient's Chart, lab work & pertinent test results  Airway Mallampati: III  TM Distance: >3 FB Neck ROM: Full    Dental no notable dental hx. (+) Dental Advisory Given   Pulmonary neg pulmonary ROS,  breath sounds clear to auscultation  Pulmonary exam normal       Cardiovascular negative cardio ROS Normal cardiovascular examRhythm:Regular Rate:Normal     Neuro/Psych negative neurological ROS  negative psych ROS   GI/Hepatic negative GI ROS, Neg liver ROS,   Endo/Other  obesity  Renal/GU negative Renal ROS  negative genitourinary   Musculoskeletal negative musculoskeletal ROS (+)   Abdominal   Peds negative pediatric ROS (+)  Hematology negative hematology ROS (+)   Anesthesia Other Findings   Reproductive/Obstetrics (+) Pregnancy                             Anesthesia Physical Anesthesia Plan  ASA: II  Anesthesia Plan: Epidural   Post-op Pain Management:    Induction:   Airway Management Planned:   Additional Equipment:   Intra-op Plan:   Post-operative Plan:   Informed Consent: I have reviewed the patients History and Physical, chart, labs and discussed the procedure including the risks, benefits and alternatives for the proposed anesthesia with the patient or authorized representative who has indicated his/her understanding and acceptance.   Dental advisory given  Plan Discussed with: CRNA  Anesthesia Plan Comments:         Anesthesia Quick Evaluation

## 2015-01-28 NOTE — Progress Notes (Signed)
Spoke with Dr. Clearance Coots - telephone order to restart pitocin 1:1.

## 2015-01-28 NOTE — Progress Notes (Signed)
Shaila Gilchrest is a 24 y.o. G1P0 at [redacted]w[redacted]d by ultrasound admitted for induction of labor due to Post dates. Due date 01/19/15.  Subjective:   Objective: BP 123/62 mmHg  Pulse 77  Temp(Src) 98.6 F (37 C) (Oral)  Resp 18  Ht  (1.626 m)  Wt 182 lb (82.555 kg)  BMI 31.22 kg/m2  LMP 04/29/2014 (Within Days)      FHT:  FHR: 130 bpm, variability: moderate,  accelerations:  Present,  decelerations:  Absent UC:   regular, every 3-4 minutes SVE:   Dilation: 3.5 Effacement (%): 50 Station: -2 Exam by:: Heather K rn  Labs: Lab Results  Component Value Date   WBC 12.7* 01/27/2015   HGB 10.4* 01/27/2015   HCT 32.0* 01/27/2015   MCV 82.9 01/27/2015   PLT 330 01/27/2015    Assessment / Plan: Induction of labor due to postterm,  progressing well on pitocin  Labor: Progressing normally Preeclampsia:  no signs or symptoms of toxicity Fetal Wellbeing:  Category I Pain Control:  Labor support without medications and will give IVP pain medications as requested by patient I/D:  n/a Anticipated MOD:  NSVD  Rafeal Skibicki A Amisha Pospisil 01/28/2015, 8:23 AM

## 2015-01-28 NOTE — Anesthesia Procedure Notes (Signed)
Epidural Patient location during procedure: OB  Staffing Anesthesiologist: Cloria Ciresi Performed by: anesthesiologist   Preanesthetic Checklist Completed: patient identified, site marked, surgical consent, pre-op evaluation, timeout performed, IV checked, risks and benefits discussed and monitors and equipment checked  Epidural Patient position: sitting Prep: site prepped and draped and DuraPrep Patient monitoring: continuous pulse ox and blood pressure Approach: midline Location: L3-L4 Injection technique: LOR saline  Needle:  Needle type: Tuohy  Needle gauge: 17 G Needle length: 9 cm and 9 Needle insertion depth: 6 cm Catheter type: closed end flexible Catheter size: 19 Gauge Catheter at skin depth: 10 cm Test dose: negative  Assessment Events: blood not aspirated, injection not painful, no injection resistance, negative IV test and no paresthesia  Additional Notes Patient identified. Risks/Benefits/Options discussed with patient including but not limited to bleeding, infection, nerve damage, paralysis, failed block, incomplete pain control, headache, blood pressure changes, nausea, vomiting, reactions to medication both or allergic, itching and postpartum back pain. Confirmed with bedside nurse the patient's most recent platelet count. Confirmed with patient that they are not currently taking any anticoagulation, have any bleeding history or any family history of bleeding disorders. Patient expressed understanding and wished to proceed. All questions were answered. Sterile technique was used throughout the entire procedure. Please see nursing notes for vital signs. Test dose was given through epidural catheter and negative prior to continuing to dose epidural or start infusion. Warning signs of high block given to the patient including shortness of breath, tingling/numbness in hands, complete motor block, or any concerning symptoms with instructions to call for help. Patient was  given instructions on fall risk and not to get out of bed. All questions and concerns addressed with instructions to call with any issues or inadequate analgesia.    

## 2015-01-29 ENCOUNTER — Encounter (HOSPITAL_COMMUNITY): Payer: Self-pay

## 2015-01-29 ENCOUNTER — Encounter (HOSPITAL_COMMUNITY)
Admission: RE | Disposition: A | Discharge status: Home / Self Care | Payer: Self-pay | Source: Ambulatory Visit | Attending: Obstetrics

## 2015-01-29 DIAGNOSIS — Z98891 History of uterine scar from previous surgery: Secondary | ICD-10-CM

## 2015-01-29 SURGERY — Surgical Case
Anesthesia: Epidural

## 2015-01-29 MED ORDER — SODIUM BICARBONATE 8.4 % IV SOLN
INTRAVENOUS | Status: DC | PRN
  Administered 2015-01-29: 5 mL via EPIDURAL
  Administered 2015-01-29: 3 mL via EPIDURAL
  Administered 2015-01-29: 5 mL via EPIDURAL
  Administered 2015-01-29: 2 mL via EPIDURAL
  Administered 2015-01-29: 5 mL via EPIDURAL

## 2015-01-29 MED ORDER — MEPERIDINE HCL 25 MG/ML IJ SOLN: 6.2500 mg | INTRAMUSCULAR | Status: DC | PRN

## 2015-01-29 MED ORDER — ONDANSETRON HCL 4 MG/2ML IJ SOLN
4.0000 mg | Freq: Once | INTRAMUSCULAR | Status: DC | PRN
Start: 2015-01-29 — End: 2015-01-29

## 2015-01-29 MED ORDER — OXYTOCIN 10 UNIT/ML IJ SOLN
40.0000 [IU] | INTRAVENOUS | Status: DC | PRN
  Administered 2015-01-29: 40 [IU] via INTRAVENOUS

## 2015-01-29 MED ORDER — NALBUPHINE HCL 10 MG/ML IJ SOLN: 5.0000 mg | Freq: Once | INTRAMUSCULAR | Status: AC | PRN

## 2015-01-29 MED ORDER — NALOXONE HCL 1 MG/ML IJ SOLN
1.0000 ug/kg/h | INTRAVENOUS | Status: DC | PRN
  Filled 2015-01-29: qty 2

## 2015-01-29 MED ORDER — SCOPOLAMINE 1 MG/3DAYS TD PT72
MEDICATED_PATCH | TRANSDERMAL | Status: DC | PRN
  Administered 2015-01-29: 1 via TRANSDERMAL

## 2015-01-29 MED ORDER — ACETAMINOPHEN 325 MG PO TABS: 650.0000 mg | ORAL_TABLET | ORAL | Status: DC | PRN

## 2015-01-29 MED ORDER — MORPHINE SULFATE 0.5 MG/ML IJ SOLN
INTRAMUSCULAR | Status: AC
  Filled 2015-01-29: qty 100

## 2015-01-29 MED ORDER — OXYTOCIN 40 UNITS IN LACTATED RINGERS INFUSION - SIMPLE MED: 62.5000 mL/h | INTRAVENOUS | Status: AC

## 2015-01-29 MED ORDER — ONDANSETRON HCL 4 MG/2ML IJ SOLN
INTRAMUSCULAR | Status: AC
  Filled 2015-01-29: qty 2

## 2015-01-29 MED ORDER — OXYCODONE-ACETAMINOPHEN 5-325 MG PO TABS
1.0000 | ORAL_TABLET | ORAL | Status: DC | PRN
  Administered 2015-01-30 – 2015-02-01 (×6): 1 via ORAL
  Filled 2015-01-29 (×5): qty 1

## 2015-01-29 MED ORDER — WITCH HAZEL-GLYCERIN EX PADS: 1.0000 "application " | MEDICATED_PAD | CUTANEOUS | Status: DC | PRN

## 2015-01-29 MED ORDER — FENTANYL CITRATE (PF) 100 MCG/2ML IJ SOLN: 25.0000 ug | INTRAMUSCULAR | Status: DC | PRN

## 2015-01-29 MED ORDER — DIBUCAINE 1 % RE OINT: 1.0000 "application " | TOPICAL_OINTMENT | RECTAL | Status: DC | PRN

## 2015-01-29 MED ORDER — KETOROLAC TROMETHAMINE 30 MG/ML IJ SOLN
INTRAMUSCULAR | Status: AC
  Filled 2015-01-29: qty 1

## 2015-01-29 MED ORDER — ZOLPIDEM TARTRATE 5 MG PO TABS: 5.0000 mg | ORAL_TABLET | Freq: Every evening | ORAL | Status: DC | PRN

## 2015-01-29 MED ORDER — NALBUPHINE HCL 10 MG/ML IJ SOLN
5.0000 mg | Freq: Once | INTRAMUSCULAR | Status: AC | PRN
  Administered 2015-01-29: 5 mg via SUBCUTANEOUS

## 2015-01-29 MED ORDER — SIMETHICONE 80 MG PO CHEW
80.0000 mg | CHEWABLE_TABLET | Freq: Three times a day (TID) | ORAL | Status: DC
  Administered 2015-01-29 – 2015-02-01 (×7): 80 mg via ORAL
  Filled 2015-01-29 (×7): qty 1

## 2015-01-29 MED ORDER — DIPHENHYDRAMINE HCL 25 MG PO CAPS: 25.0000 mg | ORAL_CAPSULE | ORAL | Status: DC | PRN

## 2015-01-29 MED ORDER — DIPHENHYDRAMINE HCL 25 MG PO CAPS: 25.0000 mg | ORAL_CAPSULE | Freq: Four times a day (QID) | ORAL | Status: DC | PRN

## 2015-01-29 MED ORDER — SIMETHICONE 80 MG PO CHEW
80.0000 mg | CHEWABLE_TABLET | ORAL | Status: DC
  Administered 2015-01-29 – 2015-01-31 (×3): 80 mg via ORAL
  Filled 2015-01-29 (×3): qty 1

## 2015-01-29 MED ORDER — KETOROLAC TROMETHAMINE 30 MG/ML IJ SOLN: 30.0000 mg | Freq: Four times a day (QID) | INTRAMUSCULAR | Status: DC | PRN

## 2015-01-29 MED ORDER — TETANUS-DIPHTH-ACELL PERTUSSIS 5-2.5-18.5 LF-MCG/0.5 IM SUSP: 0.5000 mL | Freq: Once | INTRAMUSCULAR | Status: DC

## 2015-01-29 MED ORDER — NALBUPHINE HCL 10 MG/ML IJ SOLN
5.0000 mg | INTRAMUSCULAR | Status: DC | PRN
  Filled 2015-01-29: qty 1

## 2015-01-29 MED ORDER — KETOROLAC TROMETHAMINE 30 MG/ML IJ SOLN
30.0000 mg | Freq: Four times a day (QID) | INTRAMUSCULAR | Status: DC | PRN
Start: 2015-01-29 — End: 2015-01-29
  Administered 2015-01-29: 30 mg via INTRAMUSCULAR

## 2015-01-29 MED ORDER — MORPHINE SULFATE (PF) 0.5 MG/ML IJ SOLN
INTRAMUSCULAR | Status: DC | PRN
  Administered 2015-01-29: 4 mg via EPIDURAL
  Administered 2015-01-29: 1 mg via EPIDURAL

## 2015-01-29 MED ORDER — LACTATED RINGERS IV SOLN
INTRAVENOUS | Status: DC | PRN
  Administered 2015-01-28 – 2015-01-29 (×2): via INTRAVENOUS

## 2015-01-29 MED ORDER — SCOPOLAMINE 1 MG/3DAYS TD PT72
MEDICATED_PATCH | TRANSDERMAL | Status: AC
  Filled 2015-01-29: qty 1

## 2015-01-29 MED ORDER — SODIUM CHLORIDE 0.9 % IJ SOLN: 3.0000 mL | INTRAMUSCULAR | Status: DC | PRN

## 2015-01-29 MED ORDER — ONDANSETRON HCL 4 MG/2ML IJ SOLN: 4.0000 mg | Freq: Three times a day (TID) | INTRAMUSCULAR | Status: DC | PRN

## 2015-01-29 MED ORDER — SENNOSIDES-DOCUSATE SODIUM 8.6-50 MG PO TABS
2.0000 | ORAL_TABLET | ORAL | Status: DC
  Administered 2015-01-29 – 2015-01-31 (×3): 2 via ORAL
  Filled 2015-01-29 (×3): qty 2

## 2015-01-29 MED ORDER — ONDANSETRON HCL 4 MG/2ML IJ SOLN
INTRAMUSCULAR | Status: DC | PRN
  Administered 2015-01-29: 4 mg via INTRAVENOUS

## 2015-01-29 MED ORDER — PRENATAL MULTIVITAMIN CH
1.0000 | ORAL_TABLET | Freq: Every day | ORAL | Status: DC
  Administered 2015-01-29 – 2015-02-01 (×4): 1 via ORAL
  Filled 2015-01-29 (×4): qty 1

## 2015-01-29 MED ORDER — SCOPOLAMINE 1 MG/3DAYS TD PT72: 1.0000 | MEDICATED_PATCH | Freq: Once | TRANSDERMAL | Status: DC

## 2015-01-29 MED ORDER — LANOLIN HYDROUS EX OINT: 1.0000 "application " | TOPICAL_OINTMENT | CUTANEOUS | Status: DC | PRN

## 2015-01-29 MED ORDER — OXYCODONE-ACETAMINOPHEN 5-325 MG PO TABS: 2.0000 | ORAL_TABLET | ORAL | Status: DC | PRN

## 2015-01-29 MED ORDER — DIPHENHYDRAMINE HCL 50 MG/ML IJ SOLN: 12.5000 mg | INTRAMUSCULAR | Status: DC | PRN

## 2015-01-29 MED ORDER — IBUPROFEN 600 MG PO TABS
600.0000 mg | ORAL_TABLET | Freq: Four times a day (QID) | ORAL | Status: DC
  Administered 2015-01-29 – 2015-02-01 (×13): 600 mg via ORAL
  Filled 2015-01-29 (×13): qty 1

## 2015-01-29 MED ORDER — NALOXONE HCL 0.4 MG/ML IJ SOLN: 0.4000 mg | INTRAMUSCULAR | Status: DC | PRN

## 2015-01-29 MED ORDER — MENTHOL 3 MG MT LOZG: 1.0000 | LOZENGE | OROMUCOSAL | Status: DC | PRN

## 2015-01-29 MED ORDER — SIMETHICONE 80 MG PO CHEW: 80.0000 mg | CHEWABLE_TABLET | ORAL | Status: DC | PRN

## 2015-01-29 MED ORDER — LACTATED RINGERS IV SOLN
INTRAVENOUS | Status: DC
  Administered 2015-01-29 (×2): via INTRAVENOUS

## 2015-01-29 MED ORDER — NALBUPHINE HCL 10 MG/ML IJ SOLN: 5.0000 mg | INTRAMUSCULAR | Status: DC | PRN

## 2015-01-29 MED ORDER — OXYTOCIN 10 UNIT/ML IJ SOLN
INTRAMUSCULAR | Status: AC
  Filled 2015-01-29: qty 4

## 2015-01-29 MED ORDER — PHENYLEPHRINE HCL 10 MG/ML IJ SOLN
INTRAMUSCULAR | Status: DC | PRN
  Administered 2015-01-29: 80 ug via INTRAVENOUS
  Administered 2015-01-29: 40 ug via INTRAVENOUS
  Administered 2015-01-29: 80 ug via INTRAVENOUS

## 2015-01-29 MED ORDER — CEFAZOLIN SODIUM-DEXTROSE 2-3 GM-% IV SOLR
INTRAVENOUS | Status: AC
  Filled 2015-01-29: qty 50

## 2015-01-29 SURGICAL SUPPLY — 34 items
BENZOIN TINCTURE PRP APPL 2/3 (GAUZE/BANDAGES/DRESSINGS) ×3 IMPLANT
CLAMP CORD UMBIL (MISCELLANEOUS) IMPLANT
CLOSURE WOUND 1/2 X4 (GAUZE/BANDAGES/DRESSINGS) ×1
CLOTH BEACON ORANGE TIMEOUT ST (SAFETY) ×3 IMPLANT
DRAPE SHEET LG 3/4 BI-LAMINATE (DRAPES) IMPLANT
DRSG OPSITE POSTOP 4X10 (GAUZE/BANDAGES/DRESSINGS) ×3 IMPLANT
DURAPREP 26ML APPLICATOR (WOUND CARE) ×3 IMPLANT
ELECT REM PT RETURN 9FT ADLT (ELECTROSURGICAL) ×3
ELECTRODE REM PT RTRN 9FT ADLT (ELECTROSURGICAL) ×1 IMPLANT
EXTRACTOR VACUUM M CUP 4 TUBE (SUCTIONS) IMPLANT
EXTRACTOR VACUUM M CUP 4' TUBE (SUCTIONS)
GLOVE BIO SURGEON STRL SZ8.5 (GLOVE) ×3 IMPLANT
GOWN STRL REUS W/TWL 2XL LVL3 (GOWN DISPOSABLE) ×3 IMPLANT
GOWN STRL REUS W/TWL LRG LVL3 (GOWN DISPOSABLE) ×3 IMPLANT
KIT ABG SYR 3ML LUER SLIP (SYRINGE) IMPLANT
LIQUID BAND (GAUZE/BANDAGES/DRESSINGS) ×3 IMPLANT
NEEDLE HYPO 25X5/8 SAFETYGLIDE (NEEDLE) IMPLANT
NS IRRIG 1000ML POUR BTL (IV SOLUTION) ×3 IMPLANT
PACK C SECTION WH (CUSTOM PROCEDURE TRAY) ×3 IMPLANT
PAD OB MATERNITY 4.3X12.25 (PERSONAL CARE ITEMS) ×3 IMPLANT
STRIP CLOSURE SKIN 1/2X4 (GAUZE/BANDAGES/DRESSINGS) ×2 IMPLANT
SUT CHROMIC 0 CT 802H (SUTURE) ×3 IMPLANT
SUT CHROMIC 0 MO4 CR (SUTURE) IMPLANT
SUT CHROMIC 1 CTX 36 (SUTURE) ×6 IMPLANT
SUT CHROMIC 2 0 SH (SUTURE) ×3 IMPLANT
SUT GUT PLAIN 0 CT-3 TAN 27 (SUTURE) IMPLANT
SUT MON AB 4-0 PS1 27 (SUTURE) ×3 IMPLANT
SUT PDS AB 0 CTX 36 PDP370T (SUTURE) IMPLANT
SUT VIC AB 0 CT1 18XCR BRD8 (SUTURE) IMPLANT
SUT VIC AB 0 CT1 8-18 (SUTURE)
SUT VIC AB 0 CTX 36 (SUTURE) ×4
SUT VIC AB 0 CTX36XBRD ANBCTRL (SUTURE) ×2 IMPLANT
TOWEL OR 17X24 6PK STRL BLUE (TOWEL DISPOSABLE) ×3 IMPLANT
TRAY FOLEY CATH SILVER 14FR (SET/KITS/TRAYS/PACK) ×3 IMPLANT

## 2015-01-29 NOTE — Addendum Note (Signed)
Addendum  created 01/29/15 1722 by Algis Greenhouse, CRNA   Modules edited: Notes Section   Notes Section:  File: [id number]

## 2015-01-29 NOTE — Progress Notes (Signed)
Patient ID: Tara Strickland, female   DOB: Apr 07, 1991, 24 y.o.   MRN: [id number] Patient has been 5 cm with no change through the night in adequate labor vertex molded to -1 station she also has late decelerations with each contraction and the Pitocin was discontinued and amnioinfusion performed Pitocin restarted however the lates are still persistent so patient delivered this delivered by C-section because of nonreassuring fetal heart rate tracing and failure to progress in labor

## 2015-01-29 NOTE — Anesthesia Postprocedure Evaluation (Signed)
  Anesthesia Post-op Note  Patient: Tara Strickland  Procedure(s) Performed: Procedure(s) (LRB): CESAREAN SECTION (N/A)  Patient Location: PACU  Anesthesia Type: Epidural  Level of Consciousness: awake and alert   Airway and Oxygen Therapy: Patient Spontanous Breathing  Post-op Pain: mild  Post-op Assessment: Post-op Vital signs reviewed, Patient's Cardiovascular Status Stable, Respiratory Function Stable, Patent Airway and No signs of Nausea or vomiting  Last Vitals:  Filed Vitals:   01/29/15 0600  BP: 110/50  Pulse: 68  Temp:   Resp: 19    Post-op Vital Signs: stable   Complications: No apparent anesthesia complications

## 2015-01-29 NOTE — Plan of Care (Signed)
Problem: Phase II Progression Outcomes Goal: Circumcision Outcome: Not Applicable Date Met:  87/18/36 To be done in md office

## 2015-01-29 NOTE — Op Note (Signed)
Preop diagnosis failure to progress in labor nonreassuring fetal heart rate tracing Postop diagnosis the same Anesthesia epidural Surgeon Dr. Francoise Ceo Procedure patient on the operating table in the supine position abdomen prepped and draped bladder emptied with a Foley catheter a transverse suprapubic incision made carried down to the rectus fascia fascia cleaned and incised the length of the incision recti muscles retracted laterally peritoneum incised longitudinally transverse incision made in the visceral peritoneum above the bladder and the bladder mobilized inferiorly transverse lower uterine incision made patient delivered from the  Op  position of a female Apgar 4 and 9 there was a nuchal cord and  he placenta was posterior fundal removed manually and sent to pathology uterine cavity clean with moist laps uterine incision closed in 2 layers with continuous suture of #1 chromic hemostasis satisfactory bladder flap reattached with 2-0 chromic uterus well contracted abdomen closed peritoneum continuous with of 0 chromic fascia continuous with  o dexon  the skin  Closed with  subcuticular stitch of 4-0 Monocryl blood loss 700 cc patient tolerated the procedure well

## 2015-01-29 NOTE — Transfer of Care (Signed)
Immediate Anesthesia Transfer of Care Note  Patient: Tara Strickland  Procedure(s) Performed: Procedure(s): CESAREAN SECTION (N/A)  Patient Location: PACU  Anesthesia Type:Epidural  Level of Consciousness: awake, alert  and oriented  Airway & Oxygen Therapy: Patient Spontanous Breathing  Post-op Assessment: Report given to RN and Post -op Vital signs reviewed and stable  Post vital signs: Reviewed and stable  Last Vitals:  Filed Vitals:   01/29/15 0341  BP: 143/82  Pulse: 107  Temp:   Resp:     Complications: No apparent anesthesia complications

## 2015-01-29 NOTE — Lactation Note (Signed)
This note was copied from the chart of Tara Patrina Andreas. Lactation Consultation Note  Patient Name: Tara Strickland ZOXWR'U Date: 01/29/2015 Reason for consult: Initial assessment Mom had just latched baby when [facility] arrived. Demonstrated how to obtain more depth, baby demonstrating a good rhythmic suck, swallows noted. Basic teaching reviewed with Mom. Lactation brochure left for review, advised of OP services and support group. Encouraged to call for assist as needed.   Maternal Data Has patient been taught Hand Expression?: Yes Does the patient have breastfeeding experience prior to this delivery?: No  Feeding Feeding Type: Breast Fed Length of feed: 0 min  LATCH Score/Interventions Latch: Repeated attempts needed to sustain latch, nipple held in mouth throughout feeding, stimulation needed to elicit sucking reflex. Intervention(s): Skin to skin;Teach feeding cues;Waking techniques Intervention(s): Adjust position;Assist with latch;Breast massage;Breast compression  Audible Swallowing: A few with stimulation Intervention(s): Hand expression  Type of Nipple: Everted at rest and after stimulation (short shaft bilateral) Intervention(s): Reverse pressure  Comfort (Breast/Nipple): Soft / non-tender     Hold (Positioning): Assistance needed to correctly position infant at breast and maintain latch. Intervention(s): Breastfeeding basics reviewed;Support Pillows;Position options;Skin to skin  LATCH Score: 7  Lactation Tools Discussed/Used WIC Program: Yes   Consult Status Consult Status: Follow-up Date: 01/30/15 Follow-up type: In-patient    Alfred Levins 01/29/2015, 8:10 PM

## 2015-01-29 NOTE — Anesthesia Postprocedure Evaluation (Signed)
Anesthesia Post Note  Patient: Tara Strickland  Procedure(s) Performed: Procedure(s) (LRB): CESAREAN SECTION (N/A)  Anesthesia type: Epidural  Patient location: Mother/Baby  Post pain: Pain level controlled  Post assessment: Post-op Vital signs reviewed  Last Vitals:  Filed Vitals:   01/29/15 1430  BP: 107/50  Pulse: 84  Temp: 36.6 C  Resp: 18    Post vital signs: Reviewed  Level of consciousness:alert  Complications: No apparent anesthesia complications

## 2015-01-30 LAB — CBC
HCT: 27.2 % — ABNORMAL LOW (ref 36.0–46.0)
Hemoglobin: 8.8 g/dL — ABNORMAL LOW (ref 12.0–15.0)
MCH: 27.1 pg (ref 26.0–34.0)
MCHC: 32.4 g/dL (ref 30.0–36.0)
MCV: 83.7 fL (ref 78.0–100.0)
Platelets: 284 10*3/uL (ref 150–400)
RBC: 3.25 MIL/uL — AB (ref 3.87–5.11)
RDW: 15.7 % — AB (ref 11.5–15.5)
WBC: 14.4 10*3/uL — ABNORMAL HIGH (ref 4.0–10.5)

## 2015-01-30 NOTE — Progress Notes (Signed)
Subjective: Postpartum Day 1: Cesarean Delivery Patient reports tolerating PO and no problems voiding.    Objective: Vital signs in last 24 hours: Temp:  [97.6 F (36.4 C)-98.6 F (37 C)] 98.6 F (37 C) (08/13 0600) Pulse Rate:  [62-91] 81 (08/13 0600) Resp:  [16-20] 18 (08/13 0600) BP: (106-126)/(45-77) 106/48 mmHg (08/13 0600) SpO2:  [97 %-100 %] 97 % (08/13 0257)  Physical Exam:  General: alert and no distress Lochia: appropriate Uterine Fundus: firm Incision: healing well DVT Evaluation: No evidence of DVT seen on physical exam.   Recent Labs  01/30/15 0532  HGB 8.8*  HCT 27.2*    Assessment/Plan: Status post Cesarean section. Doing well postoperatively.  Anemia.  Clinically stable. Continue current care.  Tara Strickland A 01/30/2015, 8:59 AM

## 2015-01-30 NOTE — Lactation Note (Signed)
This note was copied from the chart of Tara Avnoor Koury. Lactation Consultation Note  Patient Name: Tara Strickland ZOXWR'U Date: 01/30/2015 Reason for consult: Follow-up assessment Baby 43 hours old. Called to assist with latching baby. Mom complains of sore right nipple that is slightly red. Assisted mom to hand express colostrum to rub on right nipple. Assisted to latch baby in football position to left breast in football position. Demonstrated to parents how to allow baby to suck finger to distract baby from sucking his tongue. This LC used a gloved finger for suck training the baby. Baby latched deeply, suckling rhythmically with a few swallows noted. Mom reports increased comfort and baby maintained a deep latch. Enc mom to nurse with cues, and provide suck training prior to latching and any time baby attempting to suckle his tongue. Enc mom to use EBM on nipples after nursing. Enc mom to call for assistance as needed.   Maternal Data    Feeding Feeding Type: Breast Fed Length of feed:  (LC assessed first 10 minutes of BF. )  LATCH Score/Interventions Latch: Grasps breast easily, tongue down, lips flanged, rhythmical sucking. Intervention(s): Skin to skin Intervention(s): Adjust position;Assist with latch  Audible Swallowing: A few with stimulation Intervention(s): Skin to skin;Hand expression  Type of Nipple: Everted at rest and after stimulation (short shaft.) Intervention(s): No intervention needed  Comfort (Breast/Nipple): Filling, red/small blisters or bruises, mild/mod discomfort  Problem noted: Mild/Moderate discomfort (right nipple.) Interventions (Mild/moderate discomfort): Hand expression  Hold (Positioning): Assistance needed to correctly position infant at breast and maintain latch. Intervention(s): Breastfeeding basics reviewed;Support Pillows;Position options;Skin to skin  LATCH Score: 7  Lactation Tools Discussed/Used     Consult Status Consult  Status: Follow-up Date: 01/31/15 Follow-up type: In-patient    Tara Strickland 01/30/2015, 11:17 PM

## 2015-01-31 NOTE — Lactation Note (Signed)
This note was copied from the chart of Tara Strickland. Lactation Consultation Note  Patient Name: Tara Courtany Mcmurphy ZOXWR'U Date: 01/31/2015 Reason for consult: Follow-up assessment Baby 66 hours old. Mom states that she is so glad that this LC came in and assisted with BF last night because baby has been nursing well every since. Mom reports that baby has not been sucking his tongue at all and is maintaining a deep latch and she is able to hear swallows. Enc mom to keep nursing with cues and to call for assistance as needed.   Maternal Data    Feeding Feeding Type: Breast Fed Length of feed: 5 min  LATCH Score/Interventions Latch: Grasps breast easily, tongue down, lips flanged, rhythmical sucking. Intervention(s): Skin to skin Intervention(s): Adjust position;Assist with latch  Audible Swallowing: A few with stimulation Intervention(s): Skin to skin  Type of Nipple: Everted at rest and after stimulation Intervention(s): No intervention needed  Comfort (Breast/Nipple): Filling, red/small blisters or bruises, mild/mod discomfort  Problem noted: Mild/Moderate discomfort Interventions (Mild/moderate discomfort): Hand massage  Hold (Positioning): Assistance needed to correctly position infant at breast and maintain latch. Intervention(s): Breastfeeding basics reviewed;Support Pillows;Position options;Skin to skin  LATCH Score: 7  Lactation Tools Discussed/Used     Consult Status Consult Status: Follow-up Date: 02/01/15 Follow-up type: In-patient    Geralynn Ochs 01/31/2015, 10:21 PM

## 2015-01-31 NOTE — Progress Notes (Signed)
Subjective: Postpartum Day 2: Cesarean Delivery Patient reports tolerating PO, + flatus and no problems voiding.    Objective: Vital signs in last 24 hours: Temp:  [98.6 F (37 C)] 98.6 F (37 C) (08/13 1800) Pulse Rate:  [111] 111 (08/13 1800) Resp:  [18] 18 (08/13 1800) BP: (118)/(69) 118/69 mmHg (08/13 1800)  Physical Exam:  General: alert and no distress Lochia: appropriate Uterine Fundus: firm Incision: healing well DVT Evaluation: No evidence of DVT seen on physical exam.   Recent Labs  01/30/15 0532  HGB 8.8*  HCT 27.2*    Assessment/Plan: Status post Cesarean section. Doing well postoperatively.  Anemia.  Clinically stable. Continue current care.  HARPER,CHARLES A 01/31/2015, 7:00 AM

## 2015-02-01 ENCOUNTER — Encounter (HOSPITAL_COMMUNITY): Payer: Self-pay | Admitting: Obstetrics

## 2015-02-01 ENCOUNTER — Ambulatory Visit: Payer: Self-pay

## 2015-02-01 MED ORDER — IBUPROFEN 600 MG PO TABS: 600.0000 mg | ORAL_TABLET | Freq: Four times a day (QID) | ORAL | Status: DC | PRN

## 2015-02-01 MED ORDER — FUSION PLUS PO CAPS: 1.0000 | ORAL_CAPSULE | Freq: Every day | ORAL | Status: DC

## 2015-02-01 MED ORDER — OXYCODONE-ACETAMINOPHEN 5-325 MG PO TABS: 2.0000 | ORAL_TABLET | ORAL | Status: DC | PRN

## 2015-02-01 NOTE — Lactation Note (Signed)
This note was copied from the chart of Tara Strickland. Lactation Consultation Note  Patient Name: Tara Lakishia Bourassa ZOXWR'U Date: 02/01/2015 Reason for consult: Follow-up assessment Assisted Mom with obtaining more depth with latch. Stressed importance of good positioning and support to help baby sustain good depth. Baby sleepy at this visit but demonstrated a few good suckles. Mom's milk is coming in, engorgement care reviewed with Mom. Encouraged to hand express or pre-pump to help with latch. Advised baby should be at the breast 8-12 times in 24 hours and with feeding ques. Baby needs to sustain the latch for 15-20 minutes, both breasts some feedings. Care for sore nipples reviewed, comfort gels given with instructions. Reminded Mom of OP services and support group. Encouraged to call for questions/concerns.   Maternal Data    Feeding Feeding Type: Breast Fed Length of feed: 5 min  LATCH Score/Interventions Latch: Grasps breast easily, tongue down, lips flanged, rhythmical sucking. Intervention(s): Adjust position;Assist with latch;Breast massage;Breast compression  Audible Swallowing: A few with stimulation  Type of Nipple: Everted at rest and after stimulation  Comfort (Breast/Nipple): Filling, red/small blisters or bruises, mild/mod discomfort  Problem noted: Filling;Mild/Moderate discomfort Interventions (Mild/moderate discomfort): Post-pump;Comfort gels  Hold (Positioning): Assistance needed to correctly position infant at breast and maintain latch. Intervention(s): Breastfeeding basics reviewed;Support Pillows;Position options;Skin to skin  LATCH Score: 7  Lactation Tools Discussed/Used Tools: Pump Breast pump type: Manual   Consult Status Consult Status: Complete Date: 02/01/15 Follow-up type: In-patient    Alfred Levins 02/01/2015, 2:46 PM

## 2015-02-01 NOTE — Discharge Summary (Signed)
Obstetric Discharge Summary Reason for Admission: induction of labor Prenatal Procedures: NST and ultrasound Intrapartum Procedures: cesarean: low cervical, transverse Postpartum Procedures: none Complications-Operative and Postpartum: none HEMOGLOBIN  Date Value Ref Range Status  01/30/2015 8.8* 12.0 - 15.0 g/dL Final   HCT  Date Value Ref Range Status  01/30/2015 27.2* 36.0 - 46.0 % Final    Physical Exam:  General: alert and no distress Lochia: appropriate Uterine Fundus: firm Incision: healing well DVT Evaluation: No evidence of DVT seen on physical exam.  Discharge Diagnoses: Term Pregnancy-delivered                                          Anemia.  Clinically stable.  Discharge Information: Date: 02/01/2015 Activity: pelvic rest Diet: routine Medications: PNV, Ibuprofen, Colace, Iron and Percocet Condition: stable Instructions: refer to practice specific booklet Discharge to: home Follow-up Information    Follow up with Roe Coombs, CNM In 2 weeks.   Specialty:  Certified Nurse Midwife   Contact information:   [address] [place] [address] [phone]       Newborn Data: Live born female  Birth Weight: 7 lb 11.1 oz (3490 g) APGAR: 4, 9  Home with mother.  HARPER,CHARLES A 02/01/2015, 8:48 AM

## 2015-02-01 NOTE — Progress Notes (Signed)
Subjective: Postpartum Day 3: Cesarean Delivery Patient reports tolerating PO, + flatus, + BM and no problems voiding.    Objective: Vital signs in last 24 hours: Temp:  [98.4 F (36.9 C)] 98.4 F (36.9 C) (08/15 0557) Pulse Rate:  [90] 90 (08/15 0557) Resp:  [16] 16 (08/15 0557) BP: (137)/(67) 137/67 mmHg (08/15 0557)  Physical Exam:  General: alert and no distress Lochia: appropriate Uterine Fundus: firm Incision: healing well DVT Evaluation: No evidence of DVT seen on physical exam.   Recent Labs  01/30/15 0532  HGB 8.8*  HCT 27.2*    Assessment/Plan: Status post Cesarean section. Doing well postoperatively.  Discharge home with standard precautions and return to clinic in 2 weeks.  HARPER,CHARLES A 02/01/2015, 8:04 AM

## 2015-02-02 ENCOUNTER — Other Ambulatory Visit: Payer: Self-pay | Admitting: Certified Nurse Midwife

## 2015-02-16 ENCOUNTER — Ambulatory Visit (INDEPENDENT_AMBULATORY_CARE_PROVIDER_SITE_OTHER): Payer: Medicaid Other | Admitting: Obstetrics

## 2015-02-16 ENCOUNTER — Encounter: Payer: Self-pay | Admitting: Obstetrics

## 2015-02-16 DIAGNOSIS — D509 Iron deficiency anemia, unspecified: Secondary | ICD-10-CM

## 2015-02-16 DIAGNOSIS — Z30011 Encounter for initial prescription of contraceptive pills: Secondary | ICD-10-CM

## 2015-02-16 MED ORDER — NORETHINDRONE 0.35 MG PO TABS: 1.0000 | ORAL_TABLET | Freq: Every day | ORAL | Status: DC

## 2015-02-16 MED ORDER — OB COMPLETE PETITE 35-5-1-200 MG PO CAPS: 1.0000 | ORAL_CAPSULE | Freq: Every day | ORAL | Status: DC

## 2015-02-16 MED ORDER — FUSION PLUS PO CAPS: 1.0000 | ORAL_CAPSULE | Freq: Every day | ORAL | Status: DC

## 2015-02-16 NOTE — Progress Notes (Signed)
Subjective:     Tara Strickland is a 24 y.o. female who presents for a postpartum visit. She is 2 weeks postpartum following a low cervical transverse Cesarean section. I have fully reviewed the prenatal and intrapartum course. The delivery was at 41 gestational weeks. Outcome: primary cesarean section, low transverse incision. Anesthesia: epidural. Postpartum course has been normal. Baby's course has been normal. Baby is feeding by breast. Bleeding thin lochia. Bowel function is normal. Bladder function is normal. Patient is not sexually active. Contraception method is abstinence. Postpartum depression screening: negative.  Tobacco, alcohol and substance abuse history reviewed.  Adult immunizations reviewed including TDAP, rubella and varicella.  The following portions of the patient's history were reviewed and updated as appropriate: allergies, current medications, past family history, past medical history, past social history, past surgical history and problem list.  Review of Systems A comprehensive review of systems was negative.   Objective:    BP 110/73 mmHg  Pulse 94  Temp(Src) 97.8 F (36.6 C)  Wt 157 lb 4.8 oz (71.351 kg)  Breastfeeding? Yes   PE:      General:  Alert and no distress      Abdomen:  Soft, NT.  Incision C, D, I and NT.      Extremities:  No C, C, E.    Assessment:     Normal postpartum exam. Pap smear not done at today's visit.     Contraceptive management  Plan:    1. Contraception: oral progesterone-only contraceptive 2. Micronor Rx 3. Follow up in: 4 weeks or as needed.   Healthy lifestyle practices reviewed

## 2015-02-17 ENCOUNTER — Encounter: Payer: Self-pay | Admitting: Obstetrics

## 2015-03-16 ENCOUNTER — Ambulatory Visit (INDEPENDENT_AMBULATORY_CARE_PROVIDER_SITE_OTHER): Payer: Medicaid Other | Admitting: Obstetrics

## 2015-03-16 DIAGNOSIS — Z3009 Encounter for other general counseling and advice on contraception: Secondary | ICD-10-CM

## 2015-03-17 ENCOUNTER — Encounter: Payer: Self-pay | Admitting: Obstetrics

## 2015-03-17 NOTE — Progress Notes (Signed)
Subjective:     Tara Strickland is a 24 y.o. female who presents for a postpartum visit. She is 6 weeks postpartum following a spontaneous vaginal delivery. I have fully reviewed the prenatal and intrapartum course. The delivery was at 41 gestational weeks. Outcome: primary cesarean section, low transverse incision. Anesthesia: epidural. Postpartum course has been normal. Baby's course has been normal. Baby is feeding by breast. Bleeding no bleeding. Bowel function is normal. Bladder function is normal. Patient is not sexually active. Contraception method is oral progesterone-only contraceptive. Postpartum depression screening: negative.  Tobacco, alcohol and substance abuse history reviewed.  Adult immunizations reviewed including TDAP, rubella and varicella.  The following portions of the patient's history were reviewed and updated as appropriate: allergies, current medications, past family history, past medical history, past social history, past surgical history and problem list.  Review of Systems A comprehensive review of systems was negative.   Objective:    BP 117/72 mmHg  Pulse 55  Temp(Src) 98.8 F (37.1 C)  Wt 150 lb (68.04 kg)  General:  alert and no distress   Breasts:  inspection negative, no nipple discharge or bleeding, no masses or nodularity palpable  Lungs: clear to auscultation bilaterally  Heart:  regular rate and rhythm, S1, S2 normal, no murmur, click, rub or gallop  Abdomen: normal findings: soft, non-tender   Vulva:  normal  Vagina: normal vagina  Cervix:  no cervical motion tenderness  Corpus: normal size, contour, position, consistency, mobility, non-tender  Adnexa:  no mass, fullness, tenderness  Rectal Exam: Not performed.          Assessment:     Normal postpartum exam. Pap smear not done at today's visit.  Plan:    1. Contraception: oral progesterone-only contraceptive 2. Continue PNV's 3. Follow up in: several months or as needed.   Healthy  lifestyle practices reviewed

## 2015-07-14 ENCOUNTER — Telehealth: Payer: Self-pay | Admitting: Obstetrics

## 2015-07-14 NOTE — Telephone Encounter (Signed)
Patient has stopped breast feeding and would like to change her birth control pills. Patient would like Rx called to her pharmacy in [place]- can not E scribe- [phone]

## 2015-07-16 NOTE — Telephone Encounter (Signed)
Loestrin 24 Fe called in to pharmacy.

## 2015-09-14 ENCOUNTER — Ambulatory Visit: Payer: Self-pay | Admitting: Obstetrics

## 2015-09-17 ENCOUNTER — Encounter: Payer: Self-pay | Admitting: Obstetrics

## 2015-09-17 ENCOUNTER — Ambulatory Visit (INDEPENDENT_AMBULATORY_CARE_PROVIDER_SITE_OTHER): Payer: Medicaid Other | Admitting: Obstetrics

## 2015-09-17 VITALS — BP 113/71 | HR 83 | Temp 98.6°F | Wt 144.0 lb

## 2015-09-17 DIAGNOSIS — Z309 Encounter for contraceptive management, unspecified: Secondary | ICD-10-CM | POA: Diagnosis not present

## 2015-09-17 DIAGNOSIS — Z3041 Encounter for surveillance of contraceptive pills: Secondary | ICD-10-CM

## 2015-09-17 DIAGNOSIS — Z01419 Encounter for gynecological examination (general) (routine) without abnormal findings: Secondary | ICD-10-CM

## 2015-09-17 MED ORDER — NORETHIN ACE-ETH ESTRAD-FE 1-20 MG-MCG(24) PO CHEW: 1.0000 | CHEWABLE_TABLET | Freq: Every morning | ORAL | Status: DC

## 2015-09-17 MED ORDER — OB COMPLETE PETITE 35-5-1-200 MG PO CAPS: 1.0000 | ORAL_CAPSULE | Freq: Every day | ORAL | Status: DC

## 2015-09-17 NOTE — Addendum Note (Signed)
Addended by: Marya LandryFOSTER, SUZANNE D on: 09/17/2015 01:30 PM   Modules accepted: Orders

## 2015-09-17 NOTE — Progress Notes (Signed)
Subjective:        Tara Strickland is a 25 y.o. female here for a routine exam.  Current complaints: none.    Personal health questionnaire:  Is patient Ashkenazi Jewish, have a family history of breast and/or ovarian cancer: no Is there a family history of uterine cancer diagnosed at age < 77, gastrointestinal cancer, urinary tract cancer, family member who is a Personnel officer syndrome-associated carrier: no Is the patient overweight and hypertensive, family history of diabetes, personal history of gestational diabetes, preeclampsia or PCOS: no Is patient over 9, have PCOS,  family history of premature CHD under age 20, diabetes, smoke, have hypertension or peripheral artery disease:  no At any time, has a partner hit, kicked or otherwise hurt or frightened you?: no Over the past 2 weeks, have you felt down, depressed or hopeless?: no Over the past 2 weeks, have you felt little interest or pleasure in doing things?:no   Gynecologic History Patient's last menstrual period was 09/13/2015. Contraception: OCP (estrogen/progesterone) Last Pap: 2016. Results were: normal Last mammogram: n/a. Results were: n/a  Obstetric History OB History  Gravida Para Term Preterm AB SAB TAB Ectopic Multiple Living  0 0    # Outcome Date GA Lbr Len/2nd Weight Sex Delivery Anes PTL Lv  1 Term 01/29/15 [redacted]w[redacted]d  7 lb 11.1 oz (3.49 kg) M CS-LTranv EPI  Y      Past Medical History  Diagnosis Date  . Medical history non-contributory     Past Surgical History  Procedure Laterality Date  . No past surgeries    . Cesarean section N/A 01/29/2015    Procedure: CESAREAN SECTION;  Surgeon: Kathreen Cosier, MD;  Location: [facility];  Service: Obstetrics;  Laterality: N/A;     Current outpatient prescriptions:  .  norethindrone (MICRONOR,CAMILA,ERRIN) 0.35 MG tablet, Take 1 tablet (0.35 mg total) by mouth daily., Disp: 1 Package, Rfl: 11 .  Prenat-FeCbn-FeAspGl-FA-Omega (OB COMPLETE PETITE) 35-5-1-200  MG CAPS, Take 1 capsule by mouth daily., Disp: 30 capsule, Rfl: 11 .  Norethin Ace-Eth Estrad-FE (MINASTRIN 24 FE) 1-20 MG-MCG(24) CHEW, Chew 1 tablet by mouth every morning., Disp: 28 tablet, Rfl: 11 No Known Allergies  Social History  Substance Use Topics  . Smoking status: Never Smoker   . Smokeless tobacco: Never Used  . Alcohol Use: No    Family History  Problem Relation Age of Onset  . Hypertension Father   . Cancer Maternal Grandmother   . Cancer Maternal Grandfather   . Alzheimer's disease Paternal Grandmother   . Hypertension Paternal Grandfather       Review of Systems  Constitutional: negative for fatigue and weight loss Respiratory: negative for cough and wheezing Cardiovascular: negative for chest pain, fatigue and palpitations Gastrointestinal: negative for abdominal pain and change in bowel habits Musculoskeletal:negative for myalgias Neurological: negative for gait problems and tremors Behavioral/Psych: negative for abusive relationship, depression Endocrine: negative for temperature intolerance   Genitourinary:negative for abnormal menstrual periods, genital lesions, hot flashes, sexual problems and vaginal discharge Integument/breast: negative for breast lump, breast tenderness, nipple discharge and skin lesion(s)    Objective:       BP 113/71 mmHg  Pulse 83  Temp(Src) 98.6 F (37 C)  Wt 144 lb (65.318 kg)  LMP 09/13/2015  Breastfeeding? No General:   alert  Skin:   no rash or abnormalities  Lungs:   clear to auscultation bilaterally  Heart:   regular rate and rhythm, S1,  S2 normal, no murmur, click, rub or gallop  Breasts:   normal without suspicious masses, skin or nipple changes or axillary nodes  Abdomen:  normal findings: no organomegaly, soft, non-tender and no hernia  Pelvis:  External genitalia: normal general appearance Urinary system: urethral meatus normal and bladder without fullness, nontender Vaginal: normal without tenderness,  induration or masses Cervix: normal appearance Adnexa: normal bimanual exam Uterus: anteverted and non-tender, normal size   Lab Review Urine pregnancy test Labs reviewed yes Radiologic studies reviewed no  Assessment:    Healthy female exam.    Contraceptive surveillance of OCP.  Pleased with OCP ( Minastrin 1/20 Fe )   Plan:    Education reviewed: low fat, low cholesterol diet, safe sex/STD prevention, self breast exams and weight bearing exercise. Contraception: OCP (estrogen/progesterone). Follow up in: 1 year.   Meds ordered this encounter  Medications  . Norethin Ace-Eth Estrad-FE (MINASTRIN 24 FE) 1-20 MG-MCG(24) CHEW    Sig: Chew 1 tablet by mouth every morning.    Dispense:  28 tablet    Refill:  11  . Prenat-FeCbn-FeAspGl-FA-Omega (OB COMPLETE PETITE) 35-5-1-200 MG CAPS    Sig: Take 1 capsule by mouth daily.    Dispense:  30 capsule    Refill:  11   No orders of the defined types were placed in this encounter.

## 2015-09-21 LAB — PAP IG W/ RFLX HPV ASCU: PAP Smear Comment: 0

## 2015-09-22 LAB — NUSWAB VAGINITIS PLUS (VG+)
CANDIDA GLABRATA, NAA: NEGATIVE
Candida albicans, NAA: NEGATIVE
Chlamydia trachomatis, NAA: NEGATIVE
Neisseria gonorrhoeae, NAA: NEGATIVE
Trich vag by NAA: NEGATIVE

## 2015-12-18 IMAGING — US US OB COMP +14 WK
1 series · 12 of 28 positions shown · non-contrast
Comparison: none

[Series 1: us ob comp +14 wk · 0.24mm/px · 134 acquisitions, 12 frames shown]
[im 5/134]
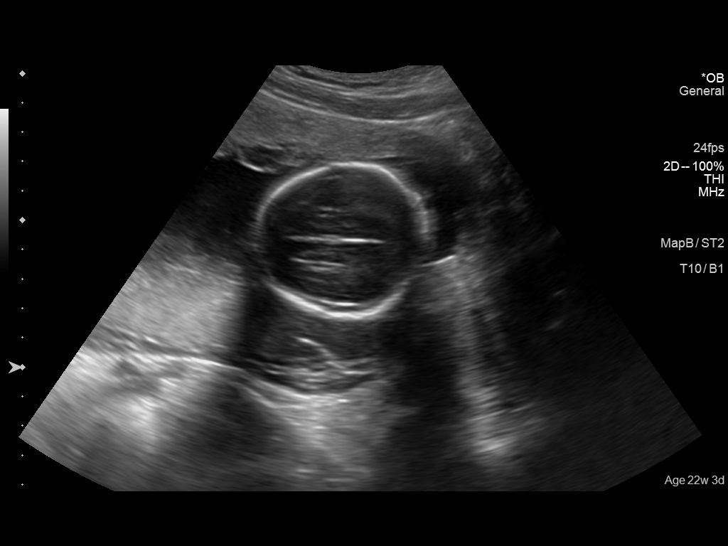
[im 15/134]
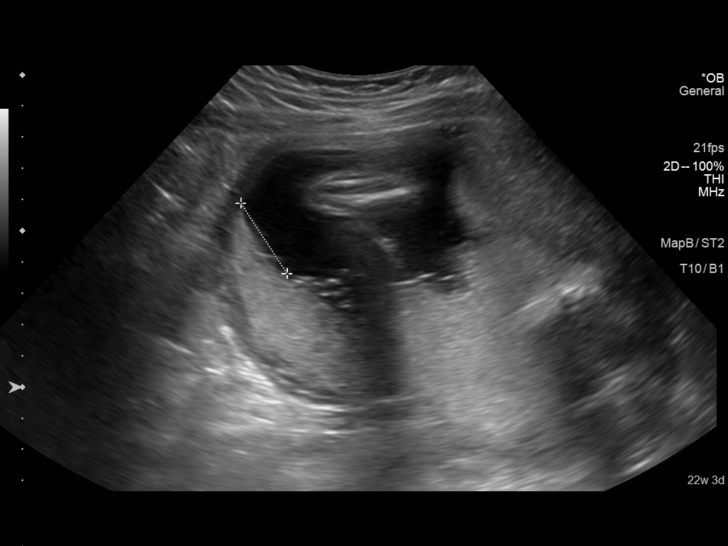
[im 25/134]
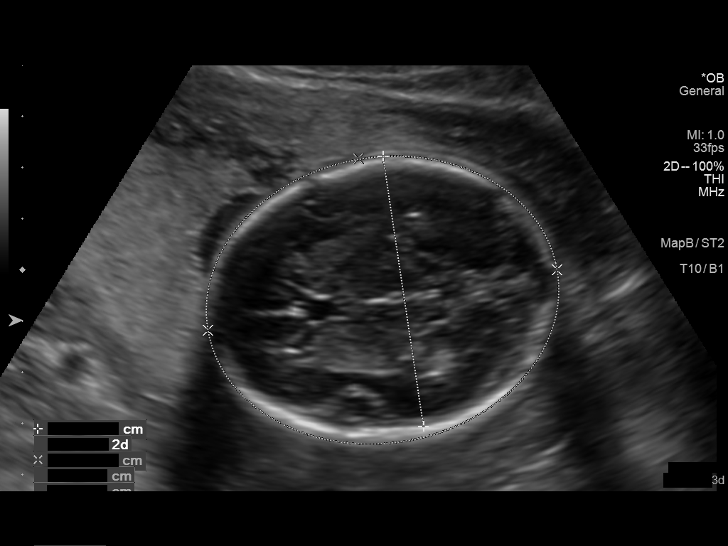
[im 40/134]
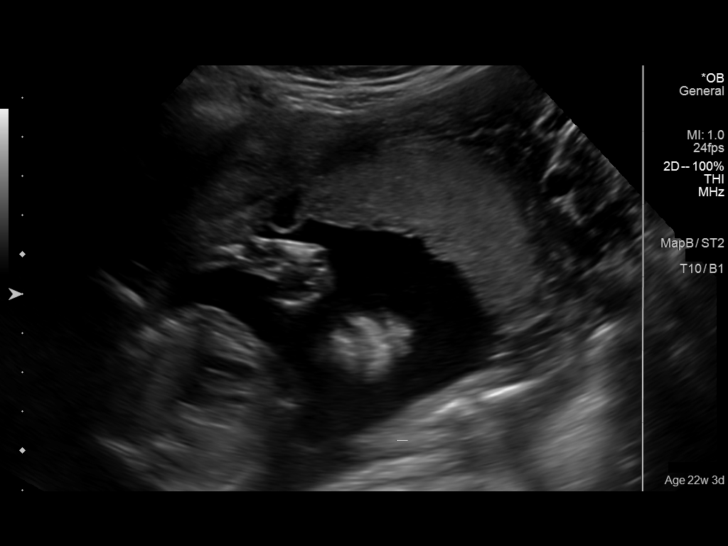
[im 50/134]
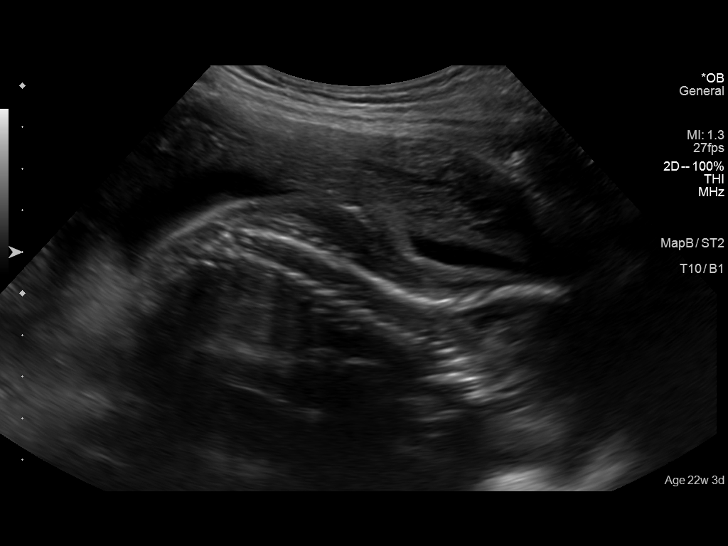
[im 60/134]
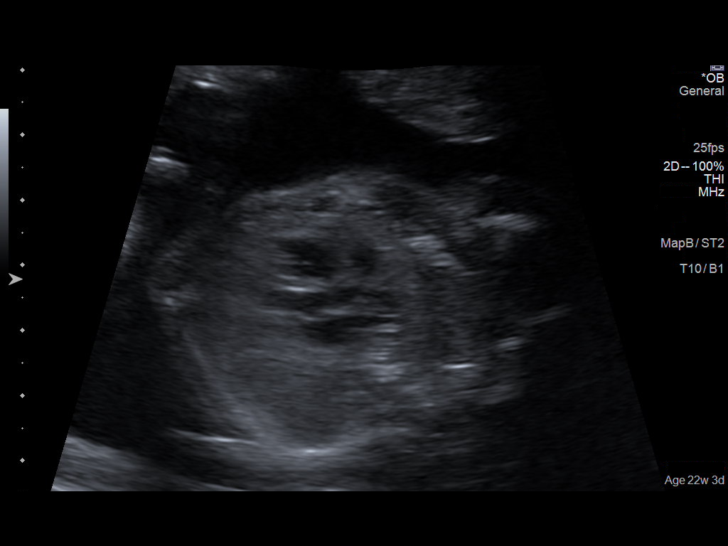
[im 74/134]
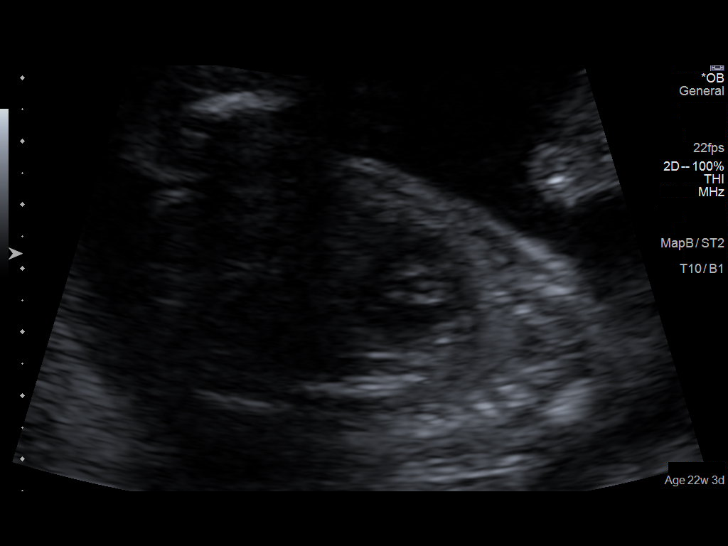
[im 84/134]
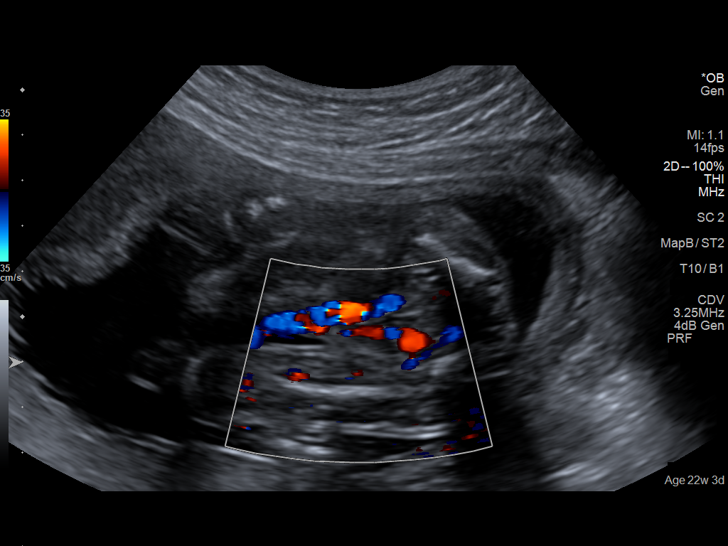
[im 94/134]
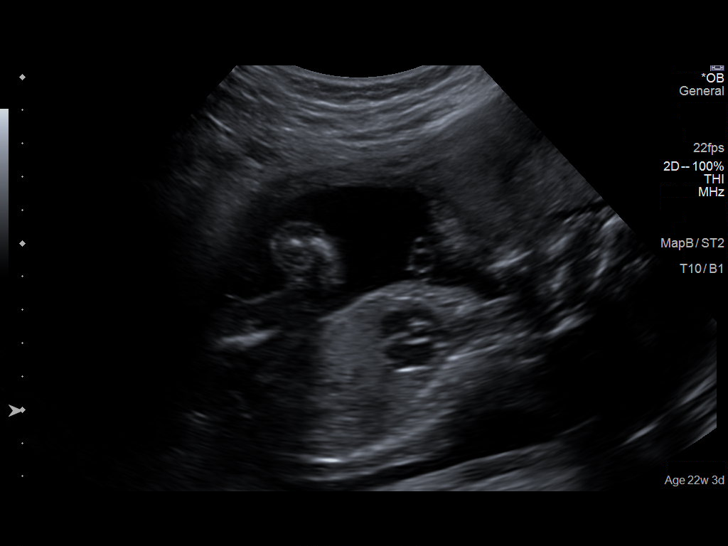
[im 109/134]
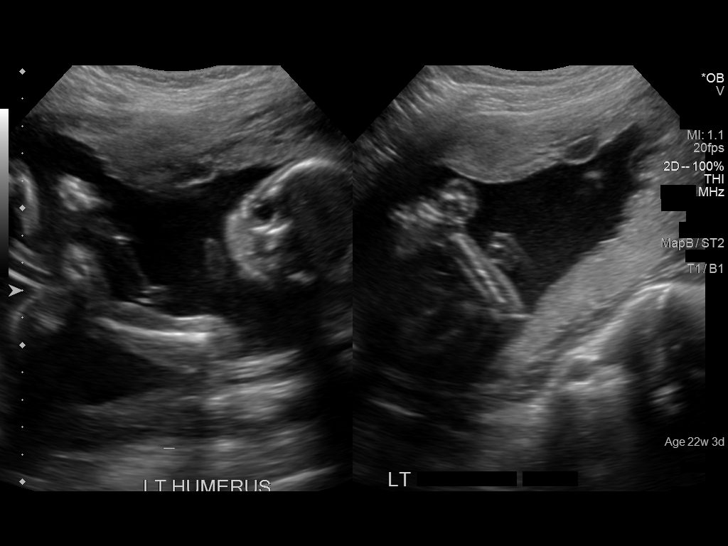
[im 119/134]
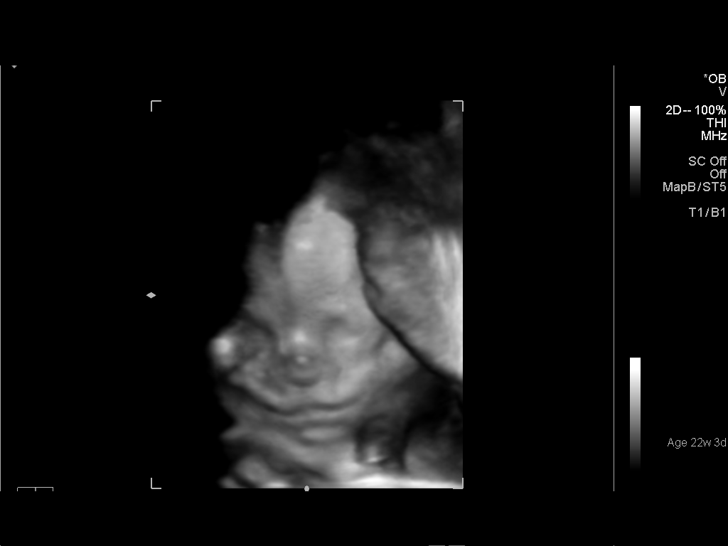
[im 129/134]
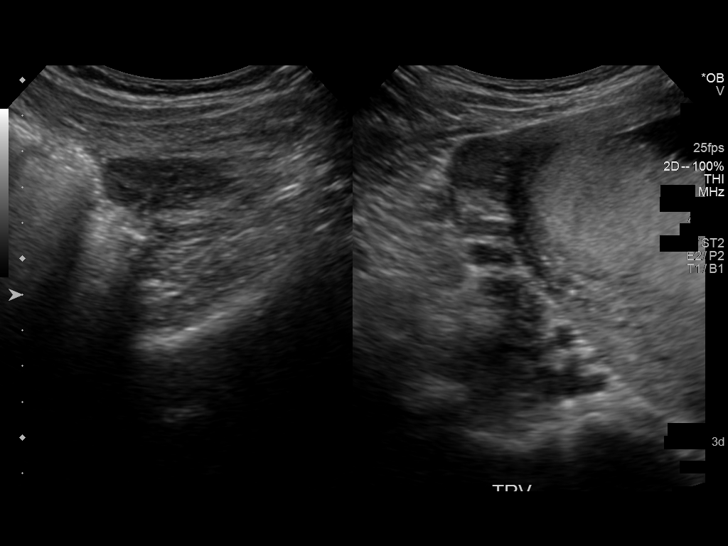

[12 of 28 positions shown; findings below may reference images not displayed]

OBSTETRICS REPORT
                      (Signed Final 09/17/2014 [DATE])

Service(s) Provided

 US OB COMP + 14 WK                                    76805.1
Indications

 Basic anatomic survey                                 z36
 22 weeks gestation of pregnancy
Fetal Evaluation

 Num Of Fetuses:    1
 Fetal Heart Rate:  147                          bpm
 Cardiac Activity:  Observed
 Presentation:      Cephalic
 Placenta:          Right lateral, above
                    cervical os
 P. Cord            Visualized, central
 Insertion:

 Amniotic Fluid
 AFI FV:      Subjectively within normal limits
                                             Larg Pckt:    4.11  cm
Biometry

 BPD:     53.3  mm     G. Age:  22w 1d                CI:         76.5   70 - 86
 OFD:     69.7  mm                                    FL/HC:      19.3   18.4 -

 HC:     197.2  mm     G. Age:  21w 6d       23  %    HC/AC:      1.15   1.06 -

 AC:     171.6  mm     G. Age:  22w 1d       37  %    FL/BPD:     71.3   71 - 87
 FL:        38  mm     G. Age:  22w 1d       35  %    FL/AC:      22.1   20 - 24
 HUM:     36.3  mm     G. Age:  22w 5d       55  %
 CER:     23.9  mm     G. Age:  22w 0d       44  %

 Est. FW:     477  gm      1 lb 1 oz     44  %
Gestational Age

 LMP:           20w 1d        Date:  04/29/14                 EDD:   02/03/15
 U/S Today:     22w 0d                                        EDD:   01/21/15
 Best:          22w 2d     Det. By:  Early Ultrasound         EDD:   01/19/15
                                     (08/12/14)
Anatomy
 Cranium:          Appears normal         Aortic Arch:      Appears normal
 Fetal Cavum:      Appears normal         Ductal Arch:      Appears normal
 Ventricles:       Appears normal         Diaphragm:        Appears normal
 Choroid Plexus:   Appears normal         Stomach:          Appears normal, left
                                                            sided
 Cerebellum:       Appears normal         Abdomen:          Appears normal
 Posterior Fossa:  Appears normal         Abdominal Wall:   Appears nml (cord
                                                            insert, abd wall)
 Nuchal Fold:      Not applicable (>20    Cord Vessels:     Appears normal (3
                   wks GA)                                  vessel cord)
 Face:             Appears normal         Kidneys:          Appear normal
                   (orbits and profile)
 Lips:             Appears normal         Bladder:          Appears normal
 Heart:            Appears normal         Spine:            Appears normal
                   (4CH, axis, and
                   situs)
 RVOT:             Appears normal         Lower             Appears normal
                                          Extremities:
 LVOT:             Appears normal         Upper             Appears normal
                                          Extremities:

 Other:  Fetus appears to be a male. Heels visualized. Technically difficult due
         to fetal position.
Targeted Anatomy

 Fetal Central Nervous System
 Lat. Ventricles:  6.5                    Cisterna Magna:
Cervix Uterus Adnexa

 Cervical Length:    4.21     cm

 Cervix:       Normal appearance by transabdominal scan.
 Uterus:       No abnormality visualized.
 Cul De Sac:   No free fluid seen.

 Left Ovary:    Within normal limits.
 Right Ovary:   Within normal limits.
 Adnexa:     No adnexal mass visualized.
Impression

 Single living intrauterine pregnancy at 22 weeks 2 days.
 Appropriate fetal growth (44%).
 Normal amniotic fluid volume.
 Normal fetal anatomy.
 No fetal anomalies or soft markers of aneuploidy seen.
Recommendations

 Follow-up ultrasounds as clinically indicated.

 questions or concerns.
                Baly, Samsunnahar

## 2016-09-18 ENCOUNTER — Ambulatory Visit: Payer: Self-pay | Admitting: Obstetrics

## 2016-09-28 ENCOUNTER — Encounter: Payer: Self-pay | Admitting: Obstetrics

## 2016-09-28 ENCOUNTER — Ambulatory Visit (INDEPENDENT_AMBULATORY_CARE_PROVIDER_SITE_OTHER): Payer: Medicaid Other | Admitting: Obstetrics

## 2016-09-28 ENCOUNTER — Other Ambulatory Visit (HOSPITAL_COMMUNITY)
Admission: RE | Admit: 2016-09-28 | Discharge: 2016-09-28 | Disposition: A | Discharge status: Home / Self Care | Payer: Medicaid Other | Source: Ambulatory Visit | Attending: Obstetrics | Admitting: Obstetrics

## 2016-09-28 VITALS — BP 129/77 | HR 89 | Temp 98.4°F | Wt 161.9 lb

## 2016-09-28 DIAGNOSIS — Z3009 Encounter for other general counseling and advice on contraception: Secondary | ICD-10-CM

## 2016-09-28 DIAGNOSIS — Z308 Encounter for other contraceptive management: Secondary | ICD-10-CM

## 2016-09-28 DIAGNOSIS — Z0000 Encounter for general adult medical examination without abnormal findings: Secondary | ICD-10-CM | POA: Diagnosis not present

## 2016-09-28 DIAGNOSIS — Z124 Encounter for screening for malignant neoplasm of cervix: Secondary | ICD-10-CM

## 2016-09-28 DIAGNOSIS — Z01419 Encounter for gynecological examination (general) (routine) without abnormal findings: Secondary | ICD-10-CM | POA: Insufficient documentation

## 2016-09-28 DIAGNOSIS — Z30011 Encounter for initial prescription of contraceptive pills: Secondary | ICD-10-CM

## 2016-09-28 DIAGNOSIS — N898 Other specified noninflammatory disorders of vagina: Secondary | ICD-10-CM

## 2016-09-28 MED ORDER — NORGESTIMATE-ETH ESTRADIOL 0.25-35 MG-MCG PO TABS: 1.0000 | ORAL_TABLET | Freq: Every day | ORAL | 11 refills | Status: DC

## 2016-09-28 NOTE — Progress Notes (Signed)
Patient presents for Annual Exam, she requested STD testing with her visit.

## 2016-09-28 NOTE — Progress Notes (Signed)
Subjective:        Tara Strickland is a 26 y.o. female here for a routine exam.  Current complaints: None.    Personal health questionnaire:  Is patient Ashkenazi Jewish, have a family history of breast and/or ovarian cancer: no Is there a family history of uterine cancer diagnosed at age < 30, gastrointestinal cancer, urinary tract cancer, family member who is a Personnel officer syndrome-associated carrier: no Is the patient overweight and hypertensive, family history of diabetes, personal history of gestational diabetes, preeclampsia or PCOS: no Is patient over 27, have PCOS,  family history of premature CHD under age 49, diabetes, smoke, have hypertension or peripheral artery disease:  no At any time, has a partner hit, kicked or otherwise hurt or frightened you?: no Over the past 2 weeks, have you felt down, depressed or hopeless?: no Over the past 2 weeks, have you felt little interest or pleasure in doing things?:no   Gynecologic History Patient's last menstrual period was 09/21/2016 (exact date). Contraception: oral progesterone-only contraceptive Last Pap: 2017. Results were: normal Last mammogram: n/a. Results were: n/a  Obstetric History OB History  Gravida Para Term Preterm AB Living  0  SAB TAB Ectopic Multiple Live Births        0 1    # Outcome Date GA Lbr Len/2nd Weight Sex Delivery Anes PTL Lv  1 Term 01/29/15 [redacted]w[redacted]d  7 lb 11.1 oz (3.49 kg) M CS-LTranv EPI  LIV      Past Medical History:  Diagnosis Date  . Medical history non-contributory     Past Surgical History:  Procedure Laterality Date  . CESAREAN SECTION N/A 01/29/2015   Procedure: CESAREAN SECTION;  Surgeon: Kathreen Cosier, MD;  Location: [facility];  Service: Obstetrics;  Laterality: N/A;  . NO PAST SURGERIES       Current Outpatient Prescriptions:  .  Norethin Ace-Eth Estrad-FE (MINASTRIN 24 FE) 1-20 MG-MCG(24) CHEW, Chew 1 tablet by mouth every morning. (Patient not taking: Reported on  09/28/2016), Disp: 28 tablet, Rfl: 11 .  norethindrone (MICRONOR,CAMILA,ERRIN) 0.35 MG tablet, Take 1 tablet (0.35 mg total) by mouth daily. (Patient not taking: Reported on 09/28/2016), Disp: 1 Package, Rfl: 11 .  norgestimate-ethinyl estradiol (ORTHO-CYCLEN,SPRINTEC,PREVIFEM) 0.25-35 MG-MCG tablet, Take 1 tablet by mouth daily., Disp: 1 Package, Rfl: 11 .  Prenat-FeCbn-FeAspGl-FA-Omega (OB COMPLETE PETITE) 35-5-1-200 MG CAPS, Take 1 capsule by mouth daily. (Patient not taking: Reported on 09/28/2016), Disp: 30 capsule, Rfl: 11 No Known Allergies  Social History  Substance Use Topics  . Smoking status: Never Smoker  . Smokeless tobacco: Never Used  . Alcohol use No    Family History  Problem Relation Age of Onset  . Hypertension Father   . Cancer Maternal Grandmother   . Cancer Maternal Grandfather   . Alzheimer's disease Paternal Grandmother   . Hypertension Paternal Grandfather       Review of Systems  Constitutional: negative for fatigue and weight loss Respiratory: negative for cough and wheezing Cardiovascular: negative for chest pain, fatigue and palpitations Gastrointestinal: negative for abdominal pain and change in bowel habits Musculoskeletal:negative for myalgias Neurological: negative for gait problems and tremors Behavioral/Psych: negative for abusive relationship, depression Endocrine: negative for temperature intolerance    Genitourinary:negative for abnormal menstrual periods, genital lesions, hot flashes, sexual problems and vaginal discharge Integument/breast: negative for breast lump, breast tenderness, nipple discharge and skin lesion(s)    Objective:       BP 129/77  Pulse 89   Temp 98.4 F (36.9 C)   Wt 161 lb 14.4 oz (73.4 kg)   LMP 09/21/2016 (Exact Date)   Breastfeeding? No   BMI 27.79 kg/m  General:   alert  Skin:   no rash or abnormalities  Lungs:   clear to auscultation bilaterally  Heart:   regular rate and rhythm, S1, S2 normal, no  murmur, click, rub or gallop  Breasts:   normal without suspicious masses, skin or nipple changes or axillary nodes  Abdomen:  normal findings: no organomegaly, soft, non-tender and no hernia  Pelvis:  External genitalia: normal general appearance Urinary system: urethral meatus normal and bladder without fullness, nontender Vaginal: normal without tenderness, induration or masses Cervix: normal appearance Adnexa: normal bimanual exam Uterus: anteverted and non-tender, normal size   Lab Review Urine pregnancy test Labs reviewed yes Radiologic studies reviewed no  50% of 20 min visit spent on counseling and coordination of care.    Assessment:    Healthy female exam.    Contraceptive management.  Wants OCP's.   Plan:   Ortho Cyclen Rx   Education reviewed: calcium supplements, depression evaluation, low fat, low cholesterol diet, safe sex/STD prevention, self breast exams and weight bearing exercise. Contraception: OCP (estrogen/progesterone). Follow up in: 1 year.   Meds ordered this encounter  Medications  . norgestimate-ethinyl estradiol (ORTHO-CYCLEN,SPRINTEC,PREVIFEM) 0.25-35 MG-MCG tablet    Sig: Take 1 tablet by mouth daily.    Dispense:  1 Package    Refill:  11   No orders of the defined types were placed in this encounter.    Patient ID: Tara Strickland, female   DOB: December 20, 1990, 26 y.o.   MRN: [id number]

## 2016-09-29 LAB — CERVICOVAGINAL ANCILLARY ONLY
BACTERIAL VAGINITIS: NEGATIVE
Candida vaginitis: NEGATIVE
Chlamydia: NEGATIVE
NEISSERIA GONORRHEA: NEGATIVE
TRICH (WINDOWPATH): NEGATIVE

## 2016-09-29 LAB — CYTOLOGY - PAP: DIAGNOSIS: NEGATIVE

## 2017-07-09 ENCOUNTER — Other Ambulatory Visit: Payer: Self-pay | Admitting: Obstetrics and Gynecology

## 2017-07-09 DIAGNOSIS — Z369 Encounter for antenatal screening, unspecified: Secondary | ICD-10-CM

## 2017-07-26 ENCOUNTER — Ambulatory Visit: Payer: Self-pay

## 2017-12-13 ENCOUNTER — Telehealth: Payer: Self-pay | Admitting: Obstetrics

## 2018-02-06 ENCOUNTER — Other Ambulatory Visit: Payer: Self-pay

## 2018-02-06 ENCOUNTER — Inpatient Hospital Stay
Admission: EM | Admit: 2018-02-06 | Discharge: 2018-02-09 | DRG: 787 | Disposition: A | Discharge status: Home / Self Care | Payer: Medicaid Other | Attending: Obstetrics and Gynecology | Admitting: Obstetrics and Gynecology

## 2018-02-06 ENCOUNTER — Inpatient Hospital Stay: Payer: Medicaid Other | Admitting: Anesthesiology

## 2018-02-06 DIAGNOSIS — O99214 Obesity complicating childbirth: Secondary | ICD-10-CM | POA: Diagnosis present

## 2018-02-06 DIAGNOSIS — O9081 Anemia of the puerperium: Secondary | ICD-10-CM | POA: Diagnosis not present

## 2018-02-06 DIAGNOSIS — D62 Acute posthemorrhagic anemia: Secondary | ICD-10-CM | POA: Diagnosis not present

## 2018-02-06 DIAGNOSIS — O34211 Maternal care for low transverse scar from previous cesarean delivery: Secondary | ICD-10-CM | POA: Diagnosis present

## 2018-02-06 DIAGNOSIS — Z3A40 40 weeks gestation of pregnancy: Secondary | ICD-10-CM

## 2018-02-06 DIAGNOSIS — E669 Obesity, unspecified: Secondary | ICD-10-CM | POA: Diagnosis present

## 2018-02-06 DIAGNOSIS — O99824 Streptococcus B carrier state complicating childbirth: Secondary | ICD-10-CM | POA: Diagnosis present

## 2018-02-06 DIAGNOSIS — O4292 Full-term premature rupture of membranes, unspecified as to length of time between rupture and onset of labor: Principal | ICD-10-CM | POA: Diagnosis present

## 2018-02-06 LAB — CBC
HCT: 39 % (ref 35.0–47.0)
Hemoglobin: 13.2 g/dL (ref 12.0–16.0)
MCH: 30.5 pg (ref 26.0–34.0)
MCHC: 33.7 g/dL (ref 32.0–36.0)
MCV: 90.4 fL (ref 80.0–100.0)
PLATELETS: 330 10*3/uL (ref 150–440)
RBC: 4.32 MIL/uL (ref 3.80–5.20)
RDW: 14.1 % (ref 11.5–14.5)
WBC: 12.7 10*3/uL — AB (ref 3.6–11.0)

## 2018-02-06 LAB — TYPE AND SCREEN
ABO/RH(D): B POS
Antibody Screen: NEGATIVE

## 2018-02-06 MED ORDER — EPHEDRINE 5 MG/ML INJ: 10.0000 mg | INTRAVENOUS | Status: DC | PRN

## 2018-02-06 MED ORDER — LACTATED RINGERS IV SOLN
INTRAVENOUS | Status: DC
  Administered 2018-02-06: 11:00:00 via INTRAVENOUS

## 2018-02-06 MED ORDER — STERILE WATER FOR INJECTION IJ SOLN
INTRAMUSCULAR | Status: AC
  Filled 2018-02-06: qty 50

## 2018-02-06 MED ORDER — ONDANSETRON HCL 4 MG/2ML IJ SOLN
4.0000 mg | Freq: Four times a day (QID) | INTRAMUSCULAR | Status: DC | PRN
  Administered 2018-02-07: 4 mg via INTRAVENOUS
  Filled 2018-02-06: qty 2

## 2018-02-06 MED ORDER — LACTATED RINGERS IV SOLN: 500.0000 mL | Freq: Once | INTRAVENOUS | Status: DC

## 2018-02-06 MED ORDER — PHENYLEPHRINE 40 MCG/ML (10ML) SYRINGE FOR IV PUSH (FOR BLOOD PRESSURE SUPPORT): 80.0000 ug | PREFILLED_SYRINGE | INTRAVENOUS | Status: DC | PRN

## 2018-02-06 MED ORDER — SOD CITRATE-CITRIC ACID 500-334 MG/5ML PO SOLN: 30.0000 mL | ORAL | Status: DC | PRN

## 2018-02-06 MED ORDER — SODIUM CHLORIDE 0.9 % IV SOLN
2.0000 g | Freq: Once | INTRAVENOUS | Status: AC
  Administered 2018-02-06: 2 g via INTRAVENOUS
  Filled 2018-02-06: qty 2000

## 2018-02-06 MED ORDER — AMMONIA AROMATIC IN INHA
RESPIRATORY_TRACT | Status: AC
  Filled 2018-02-06: qty 10

## 2018-02-06 MED ORDER — ACETAMINOPHEN 325 MG PO TABS: 650.0000 mg | ORAL_TABLET | ORAL | Status: DC | PRN

## 2018-02-06 MED ORDER — DIPHENHYDRAMINE HCL 50 MG/ML IJ SOLN: 12.5000 mg | INTRAMUSCULAR | Status: DC | PRN

## 2018-02-06 MED ORDER — LACTATED RINGERS IV SOLN: 500.0000 mL | INTRAVENOUS | Status: DC | PRN

## 2018-02-06 MED ORDER — LIDOCAINE HCL (PF) 1 % IJ SOLN
30.0000 mL | INTRAMUSCULAR | Status: DC | PRN
  Filled 2018-02-06: qty 30

## 2018-02-06 MED ORDER — BUTORPHANOL TARTRATE 2 MG/ML IJ SOLN: 1.0000 mg | INTRAMUSCULAR | Status: DC | PRN

## 2018-02-06 MED ORDER — TERBUTALINE SULFATE 1 MG/ML IJ SOLN: 0.2500 mg | Freq: Once | INTRAMUSCULAR | Status: DC | PRN

## 2018-02-06 MED ORDER — BUPIVACAINE HCL (PF) 0.25 % IJ SOLN
INTRAMUSCULAR | Status: DC | PRN
  Administered 2018-02-06: 5 mL via EPIDURAL

## 2018-02-06 MED ORDER — OXYTOCIN BOLUS FROM INFUSION: 500.0000 mL | Freq: Once | INTRAVENOUS | Status: DC

## 2018-02-06 MED ORDER — FENTANYL 2.5 MCG/ML W/ROPIVACAINE 0.15% IN NS 100 ML EPIDURAL (ARMC)
EPIDURAL | Status: AC
  Filled 2018-02-06: qty 100

## 2018-02-06 MED ORDER — OXYTOCIN 40 UNITS IN LACTATED RINGERS INFUSION - SIMPLE MED
2.5000 [IU]/h | INTRAVENOUS | Status: DC
  Filled 2018-02-06 (×3): qty 1000

## 2018-02-06 MED ORDER — SODIUM CHLORIDE 0.9 % IV SOLN: 2.0000 g | Freq: Once | INTRAVENOUS | Status: DC

## 2018-02-06 MED ORDER — FENTANYL 2.5 MCG/ML W/ROPIVACAINE 0.15% IN NS 100 ML EPIDURAL (ARMC)
12.0000 mL/h | EPIDURAL | Status: DC
  Administered 2018-02-06 – 2018-02-07 (×2): 12 mL/h via EPIDURAL
  Filled 2018-02-06: qty 100

## 2018-02-06 MED ORDER — SODIUM CHLORIDE 0.9 % IV SOLN
1.0000 g | INTRAVENOUS | Status: DC
  Administered 2018-02-06 – 2018-02-07 (×4): 1 g via INTRAVENOUS
  Filled 2018-02-06 (×8): qty 1000

## 2018-02-06 MED ORDER — OXYTOCIN 40 UNITS IN LACTATED RINGERS INFUSION - SIMPLE MED
1.0000 m[IU]/min | INTRAVENOUS | Status: DC
  Administered 2018-02-06 – 2018-02-07 (×2): 2 m[IU]/min via INTRAVENOUS

## 2018-02-06 MED ORDER — LACTATED RINGERS IV SOLN
INTRAVENOUS | Status: DC
  Administered 2018-02-06: 20:00:00 via INTRAVENOUS

## 2018-02-06 MED ORDER — MISOPROSTOL 200 MCG PO TABS
ORAL_TABLET | ORAL | Status: AC
  Filled 2018-02-06: qty 4

## 2018-02-06 NOTE — Anesthesia Procedure Notes (Signed)
Epidural Patient location during procedure: OB  Staffing Anesthesiologist: Lada Fulbright, MD Performed: anesthesiologist   Preanesthetic Checklist Completed: patient identified, site marked, surgical consent, pre-op evaluation, timeout performed, IV checked, risks and benefits discussed and monitors and equipment checked  Epidural Patient position: sitting Prep: ChloraPrep Patient monitoring: heart rate, continuous pulse ox and blood pressure Approach: midline Location: L4-L5 Injection technique: LOR saline  Needle:  Needle type: Tuohy  Needle gauge: 18 G Needle length: 9 cm and 9 Catheter type: closed end flexible Catheter size: 20 Guage Test dose: negative and 1.5% lidocaine with Epi 1:200 K  Assessment Sensory level: T10 Events: blood not aspirated, injection not painful, no injection resistance, negative IV test and no paresthesia  Additional Notes   Patient tolerated the insertion well without complications.Reason for block:procedure for pain     

## 2018-02-06 NOTE — Progress Notes (Signed)
Cory RoughenRachel Semper is a 27 y.o. G2P1001 at 7660w3d by dating  admitted for TOLAC at 40 3/7 weeks. [facility] due to non-reassuring FHT.   Subjective:   Objective: BP (!) 107/55   Pulse 77   Temp 98.4 F (36.9 C) (Oral)   Resp 18   Ht 5\' 3"  (1.6 m)   Wt 86.2 kg   LMP 04/29/2017   BMI 33.66 kg/m  I/O last 3 completed shifts: In: 567.9 [I.V.:509.8; IV Piggyback:58.1] Out: -  No intake/output data recorded.  FHT: 140, pt getting epidural UC:  q 2-5 mins SVE:   Dilation: 5 Effacement (%): 90 Station: -2, -3 Exam by:: Beatriz Stallion. Tyjai Matuszak, CNM  Labs: Lab Results  Component Value Date   WBC 12.7 (H) 02/06/2018   HGB 13.2 02/06/2018   HCT 39.0 02/06/2018   MCV 90.4 02/06/2018   PLT 330 02/06/2018    Assessment / Plan: A:1. IUP at 40 3/7 weeks 2. TOLAC 3. Cook Catheter P: Continue low dose Pitocin per protocol. 2. In house team (OB,Anesthesia, NICU, [facility]) 3. Continue to labor and monitor VS and UC/FHT's. 4. Stadol if needed and epidural if desired _____________________________ Myrtie Cruisearon W. Xyla Leisner,RN, MSN, CNM, FNP Certified Nurse Midwife [facility]  Sharee Pimplearon W Trystian Crisanto 02/06/2018, 10:06 PM

## 2018-02-06 NOTE — Progress Notes (Signed)
Patient ID: Tara RoughenRachel Haberl, female   DOB: Dec 20, 1990, 27 y.o.   MRN: [id number] Called to review the fetal strip for late decels, Strip reviewed and agree that pt had 3 late decels. Pt turned and will see if baby responds to positioning. Pt is not feeling contractions moderately at this point. Dr Elesa MassedWard in the OR and will update her as needed. Will wait to see if baby responds to resuscitative manuevers.  ______________________________ Myrtie Cruisearon W. Edita Weyenberg,RN, MSN, CNM, FNP Certified Nurse Midwife [facility]

## 2018-02-06 NOTE — H&P (Addendum)
Tara RoughenRachel Strickland is G2P1001 based on LMP of 04/29/17 with EDD of 02/03/18 and a  27 y.o. female presenting for SROM this am clear fluid at 0800 and for a TOLAC.  OB History    Gravida  2   Para  1   Term  1   Preterm      AB      Living  0     SAB      TAB      Ectopic      Multiple  0   Live Births  1          Past Medical History:  Diagnosis Date  . Medical history non-contributory    Past Surgical History:  Procedure Laterality Date  . CESAREAN SECTION N/A 01/29/2015   Procedure: CESAREAN SECTION;  Surgeon: Kathreen CosierBernard A Marshall, MD;  Location: [facility];  Service: Obstetrics;  Laterality: N/A;  . NO PAST SURGERIES     Family History: family history includes Alzheimer's disease in her paternal grandmother; Cancer in her maternal grandfather and maternal grandmother; Hypertension in her father and paternal grandfather. Social History:  reports that she has never smoked. She has never used smokeless tobacco. She reports that she does not drink alcohol or use drugs.     Maternal Diabetes:105 Genetic Screening: Not done  Maternal Ultrasounds/Referrals: WU:[id number]:normal anatomy, 2 cm mass loc near RT ovary Fetal Ultrasounds or other Referrals: N/a  Maternal Substance Abuse: Neg Significant Maternal Medications: PNV Significant Maternal Lab Results: See labs  Other Comments:    Review of Systems  Constitutional: Negative.   HENT: Negative.   Eyes: Negative.   Respiratory: Negative.   Cardiovascular: Negative.   Gastrointestinal: Negative.   Genitourinary: Negative.   Musculoskeletal: Negative.   Skin: Negative.   Neurological: Negative.   Endo/Heme/Allergies: Negative.   Psychiatric/Behavioral: Negative.   GYN: copious amts of clear fluid noted from vagina History Dilation: 1.5 Effacement (%): Thick Station: -2, -3 Exam by:: Arta SilenceK. Yates, RN Blood pressure 125/81, pulse 80, temperature 98 F (36.7 C), temperature source Oral, resp. rate 16, height 5' 3.5" (1.613 m),  weight 86.2 kg, last menstrual period 04/29/2017. Exam Physical Exam  Gen:27 yo black female in NAD HEENT: normocephalic, eyes non-icteric. HEART:S1S2, RRR, No M/R/G LUNGS: CTA bilat, no W/R/R Extrems: 1+edema, Prenatal labs: ABO, Rh:   B pos Antibody:  Neg Rubella:  Immune RPR:   NR HBsAg:   Neg HIV:   Neg GBS:   Pos Varicella: Immune TOLAC DISCUSSION: Pt informed of the risks of TOLAC including: 1-2% risk of uterine rupture, infection, bleeding, anesthesia and poss of failure to deliver vaginal necessitating a LTCS. Pt agrees and questions are answered. Dr Elesa MassedWard is texted as she is in the OR. Pt in early labor and having mild contractions. Will call in house TEAM as soon as pt in active labor process according to the policy. GBS tx will start now.   Assessment/Plan: 1. TOLAC discussed with pt and family and agrees with proceeding.  2. IUP at 40 3/7 weeks 3. OR note from 01/29/15 reviewed with Dr Gaynell FaceMarshall Adela Glimpse(Bernard) as pt having a transverse suprapubic incision. See note in Epic from [facility] 4. 2 cm adnexal mass lat to Rt ovary P: Fetal and uterine monitors continuously disc with nursing and pt 2. Inhouse Team is here and will call according to policy. 3 Plan epidural. 4. GBS pos and will give antibiotics.  ___________________________________ Myrtie Cruisearon W. Besse Miron,RN, MSN, CNM, FNP Certified Nurse Midwife [facility]  [facility]  Sharee Pimplearon W Mingo Siegert 02/06/2018, 10:45 AM

## 2018-02-06 NOTE — Progress Notes (Addendum)
Patient ID: Tara RoughenRachel Strickland, female   DOB: 06-Jun-1991, 27 y.o.   MRN: [id number]   TOLAC policy initiated at 2150 [facility] inserted with 30 ml balloon. Cx is 5/90/vtx-2. Pt tol well. Will start Low Dose Pitocin per protocol. Charge RN, Renea Eevelyn notified to call the OR, Anesthesia and NICU. Dr [facility]les[facility]Ward [facility] and on site till delivery.  PROCEDURE: Vag spec inserted and cx visualized. Betadine prep to the cx and vagina. Spec removed and Cook catheter was placed manually into the cx. The first balloon was inflated with 30 ml's and the 2nd balloon will be inflated per the nurse. Cook Catheter was tightly taped to Lt thigh. Pt tol well. ________________________ Myrtie Cruisearon W. Jones,RN, MSN, CNM, FNP Certified Nurse Midwife [facility] OB/[facility]N [facility]

## 2018-02-06 NOTE — Progress Notes (Signed)
Cory RoughenRachel Loyal is a 27 y.o. G2P1000 at 6842w3d by dating admitted for SROM at 0800  Subjective: Hurting in my back  Objective: BP 104/60 (BP Location: Left Arm)   Pulse 93   Temp 98.9 F (37.2 C) (Oral)   Resp 17   Ht 5' 3.5" (1.613 m)   Wt 86.2 kg   LMP 04/29/2017   BMI 33.13 kg/m  No intake/output data recorded. No intake/output data recorded.  FHT: 130 UC:  Q 3-4 MINS Mild SVE:   Dilation: 1.5 Effacement (%): Thick Station: -2, -3 Exam by:: Arta SilenceK. Yates, RN  Labs: Lab Results  Component Value Date   WBC 12.7 (H) 02/06/2018   HGB 13.2 02/06/2018   HCT 39.0 02/06/2018   MCV 90.4 02/06/2018   PLT 330 02/06/2018    Assessment / Plan: A: TOLAC  2. SROM x 6 hours clear 3. GBS pos P: Continue to monitor UC/FHT's 2. Omnipen 1 gm q 4 h after 2 gms bolus for GBS 3. Pt plans epidural when needed. _____________________________ Myrtie Cruisearon W. Copelan Maultsby,RN, MSN, CNM, FNP Certified Nurse Midwife [facility] OB[facility]N [facility]  Sharee Pimplearon W Wadie Liew 02/06/2018, 2:14 PM

## 2018-02-06 NOTE — Progress Notes (Signed)
Patient ID: [name], female   DOB: Jun 19, 1991, 27 y.o.   MRN: [id number]  S: Resting comfortably with some back discomfort and feeling of rectal discomfort.  O: Vitals:   02/06/18 1156 02/06/18 1426 02/06/18 1645 02/06/18 1928  BP: 104/60 (!) 110/50 (!) 104/56 (!) 107/55  Pulse: 93 81 76 77  Resp: 17 20  18   Temp: 98.9 F (37.2 C) 98.2 F (36.8 C) 97.9 F (36.6 C) 98.4 F (36.9 C)  TempSrc: Oral Oral Oral Oral  Weight: 86.2 kg     Height: 5\' 3"  (1.6 m)        Gen: NAD, AAOx3      Abd: FNTTP      Ext: Non-tender, Nonedmeatous    FHT: 145 mod var + accelerations, occas variable decelerations, Cat 2 TOCO: Irregular  SVE: 2/50%/vtx-2 not rechecked    A/P:  27 y.o. yo G2P1001 at 901w3d for TOLAC.  Labor: not in active phase  FWB: Reassuring Cat 1 tracing. EFW7#8oz  GBS: pos  Contraception:   Dr Elesa MassedWard aware of pt status and coming to hospital for in house for TOLAC protocol ___________________________ Myrtie Cruisearon W. Jones,RN, MSN, CNM, FNP Certified Nurse Midwife [facility] OB[facility]N [facility]  Sharee Pimplearon W Jones 7:53 PM

## 2018-02-06 NOTE — Anesthesia Preprocedure Evaluation (Addendum)
Anesthesia Evaluation  Patient identified by MRN, date of birth, ID band Patient awake    Reviewed: Allergy & Precautions, NPO status , Patient's Chart, lab work & pertinent test results, reviewed documented beta blocker date and time   Airway Mallampati: II  TM Distance: >3 FB     Dental  (+) Chipped   Pulmonary           Cardiovascular      Neuro/Psych    GI/Hepatic   Endo/Other    Renal/GU      Musculoskeletal   Abdominal   Peds  Hematology   Anesthesia Other Findings   Reproductive/Obstetrics                             Anesthesia Physical Anesthesia Plan  ASA: II  Anesthesia Plan: Epidural   Post-op Pain Management:    Induction:   PONV Risk Score and Plan:   Airway Management Planned:   Additional Equipment:   Intra-op Plan:   Post-operative Plan:   Informed Consent: I have reviewed the patients History and Physical, chart, labs and discussed the procedure including the risks, benefits and alternatives for the proposed anesthesia with the patient or authorized representative who has indicated his/her understanding and acceptance.     Plan Discussed with: CRNA  Anesthesia Plan Comments:         Anesthesia Quick Evaluation  

## 2018-02-07 ENCOUNTER — Encounter
Admission: EM | Disposition: A | Discharge status: Home / Self Care | Payer: Self-pay | Source: Home / Self Care | Attending: Obstetrics and Gynecology

## 2018-02-07 LAB — RPR: RPR Ser Ql: NONREACTIVE

## 2018-02-07 LAB — HIV ANTIBODY (ROUTINE TESTING W REFLEX): HIV Screen 4th Generation wRfx: NONREACTIVE

## 2018-02-07 SURGERY — Surgical Case
Anesthesia: Epidural | Site: Abdomen | Wound class: Clean Contaminated

## 2018-02-07 MED ORDER — LIDOCAINE HCL (CARDIAC) PF 100 MG/5ML IV SOSY: PREFILLED_SYRINGE | INTRAVENOUS | Status: DC | PRN

## 2018-02-07 MED ORDER — SODIUM CHLORIDE 0.9 % IV SOLN
500.0000 mg | Freq: Once | INTRAVENOUS | Status: AC
  Administered 2018-02-07: 500 mg via INTRAVENOUS
  Filled 2018-02-07: qty 500

## 2018-02-07 MED ORDER — KETOROLAC TROMETHAMINE 30 MG/ML IJ SOLN
INTRAMUSCULAR | Status: DC | PRN
  Administered 2018-02-07: 30 mg via INTRAVENOUS

## 2018-02-07 MED ORDER — LIDOCAINE 2% (20 MG/ML) 5 ML SYRINGE
INTRAMUSCULAR | Status: DC | PRN
  Administered 2018-02-07: 50 mg via INTRAVENOUS
  Administered 2018-02-07: 200 mg via INTRAVENOUS
  Administered 2018-02-07: 50 mg via INTRAVENOUS

## 2018-02-07 MED ORDER — BUPIVACAINE HCL (PF) 0.5 % IJ SOLN: 30.0000 mL | Freq: Once | INTRAMUSCULAR | Status: DC

## 2018-02-07 MED ORDER — CEFAZOLIN SODIUM-DEXTROSE 2-4 GM/100ML-% IV SOLN
2.0000 g | Freq: Once | INTRAVENOUS | Status: AC
  Administered 2018-02-07: 2 g via INTRAVENOUS
  Filled 2018-02-07: qty 100

## 2018-02-07 MED ORDER — BUPIVACAINE HCL (PF) 0.5 % IJ SOLN
INTRAMUSCULAR | Status: AC
  Filled 2018-02-07: qty 30

## 2018-02-07 MED ORDER — SENNOSIDES-DOCUSATE SODIUM 8.6-50 MG PO TABS
2.0000 | ORAL_TABLET | ORAL | Status: DC
  Administered 2018-02-08 – 2018-02-09 (×2): 2 via ORAL
  Filled 2018-02-07 (×2): qty 2

## 2018-02-07 MED ORDER — STERILE WATER FOR INJECTION IJ SOLN
INTRAMUSCULAR | Status: AC
  Filled 2018-02-07: qty 50

## 2018-02-07 MED ORDER — KETOROLAC TROMETHAMINE 30 MG/ML IJ SOLN
INTRAMUSCULAR | Status: AC
  Filled 2018-02-07: qty 1

## 2018-02-07 MED ORDER — DIPHENHYDRAMINE HCL 25 MG PO CAPS: 25.0000 mg | ORAL_CAPSULE | Freq: Four times a day (QID) | ORAL | Status: DC | PRN

## 2018-02-07 MED ORDER — SODIUM CHLORIDE 0.9 % IV SOLN: 250.0000 mL | INTRAVENOUS | Status: DC

## 2018-02-07 MED ORDER — ONDANSETRON HCL 4 MG/2ML IJ SOLN: 4.0000 mg | Freq: Once | INTRAMUSCULAR | Status: DC | PRN

## 2018-02-07 MED ORDER — ACETAMINOPHEN 40 MG HALF SUPP: 1000.0000 mg | Freq: Once | RECTAL | Status: DC

## 2018-02-07 MED ORDER — MORPHINE SULFATE (PF) 0.5 MG/ML IJ SOLN
INTRAMUSCULAR | Status: AC
  Filled 2018-02-07: qty 10

## 2018-02-07 MED ORDER — ONDANSETRON HCL 4 MG/2ML IJ SOLN
INTRAMUSCULAR | Status: AC
  Filled 2018-02-07: qty 2

## 2018-02-07 MED ORDER — DIBUCAINE 1 % RE OINT: 1.0000 "application " | TOPICAL_OINTMENT | RECTAL | Status: DC | PRN

## 2018-02-07 MED ORDER — WITCH HAZEL-GLYCERIN EX PADS: 1.0000 "application " | MEDICATED_PAD | CUTANEOUS | Status: DC | PRN

## 2018-02-07 MED ORDER — ACETAMINOPHEN 500 MG PO TABS
ORAL_TABLET | ORAL | Status: AC
  Filled 2018-02-07: qty 2

## 2018-02-07 MED ORDER — LIDOCAINE HCL (PF) 2 % IJ SOLN
INTRAMUSCULAR | Status: AC
  Filled 2018-02-07: qty 40

## 2018-02-07 MED ORDER — ACETAMINOPHEN 650 MG RE SUPP
650.0000 mg | Freq: Once | RECTAL | Status: AC
  Administered 2018-02-07: 650 mg via RECTAL
  Filled 2018-02-07: qty 1

## 2018-02-07 MED ORDER — LIDOCAINE 2% (20 MG/ML) 5 ML SYRINGE: INTRAMUSCULAR | Status: DC | PRN

## 2018-02-07 MED ORDER — SIMETHICONE 80 MG PO CHEW
160.0000 mg | CHEWABLE_TABLET | Freq: Four times a day (QID) | ORAL | Status: DC | PRN
  Administered 2018-02-07 – 2018-02-09 (×4): 160 mg via ORAL
  Filled 2018-02-07 (×4): qty 2

## 2018-02-07 MED ORDER — BUPIVACAINE LIPOSOME 1.3 % IJ SUSP: 20.0000 mL | Freq: Once | INTRAMUSCULAR | Status: DC

## 2018-02-07 MED ORDER — LACTATED RINGERS IV SOLN: INTRAVENOUS | Status: DC

## 2018-02-07 MED ORDER — FENTANYL CITRATE (PF) 100 MCG/2ML IJ SOLN
INTRAMUSCULAR | Status: AC
  Filled 2018-02-07: qty 2

## 2018-02-07 MED ORDER — SODIUM CHLORIDE 0.9 % IJ SOLN
INTRAMUSCULAR | Status: AC
  Filled 2018-02-07: qty 50

## 2018-02-07 MED ORDER — SODIUM CHLORIDE 0.9% FLUSH: 3.0000 mL | Freq: Two times a day (BID) | INTRAVENOUS | Status: DC

## 2018-02-07 MED ORDER — PRENATAL MULTIVITAMIN CH
1.0000 | ORAL_TABLET | Freq: Every day | ORAL | Status: DC
  Administered 2018-02-08 – 2018-02-09 (×2): 1 via ORAL
  Filled 2018-02-07 (×2): qty 1

## 2018-02-07 MED ORDER — OXYCODONE HCL 5 MG PO TABS
5.0000 mg | ORAL_TABLET | ORAL | Status: DC | PRN
  Administered 2018-02-07 – 2018-02-09 (×6): 5 mg via ORAL
  Filled 2018-02-07 (×7): qty 1

## 2018-02-07 MED ORDER — COCONUT OIL OIL
1.0000 "application " | TOPICAL_OIL | Status: DC | PRN
  Administered 2018-02-08: 1 via TOPICAL
  Filled 2018-02-07: qty 120

## 2018-02-07 MED ORDER — ONDANSETRON 4 MG PO TBDP
4.0000 mg | ORAL_TABLET | Freq: Four times a day (QID) | ORAL | Status: DC | PRN
  Administered 2018-02-07: 4 mg via ORAL
  Filled 2018-02-07: qty 1

## 2018-02-07 MED ORDER — SODIUM CHLORIDE 0.9% FLUSH: 3.0000 mL | INTRAVENOUS | Status: DC | PRN

## 2018-02-07 MED ORDER — BUPIVACAINE LIPOSOME 1.3 % IJ SUSP
INTRAMUSCULAR | Status: DC | PRN
  Administered 2018-02-07: 100 mL

## 2018-02-07 MED ORDER — MORPHINE SULFATE (PF) 0.5 MG/ML IJ SOLN
INTRAMUSCULAR | Status: DC | PRN
  Administered 2018-02-07: 1.5 mg via EPIDURAL

## 2018-02-07 MED ORDER — OXYCODONE HCL 5 MG PO TABS: 10.0000 mg | ORAL_TABLET | ORAL | Status: DC | PRN

## 2018-02-07 MED ORDER — IBUPROFEN 600 MG PO TABS
600.0000 mg | ORAL_TABLET | Freq: Four times a day (QID) | ORAL | Status: DC
  Administered 2018-02-07 – 2018-02-09 (×7): 600 mg via ORAL
  Filled 2018-02-07 (×8): qty 1

## 2018-02-07 MED ORDER — MENTHOL 3 MG MT LOZG
1.0000 | LOZENGE | OROMUCOSAL | Status: DC | PRN
  Filled 2018-02-07: qty 9

## 2018-02-07 MED ORDER — OXYTOCIN 40 UNITS IN LACTATED RINGERS INFUSION - SIMPLE MED
INTRAVENOUS | Status: DC | PRN
  Administered 2018-02-07: 800 mL via INTRAVENOUS
  Administered 2018-02-07: 1 mL via INTRAVENOUS
  Administered 2018-02-07: 199 mL via INTRAVENOUS

## 2018-02-07 MED ORDER — FENTANYL CITRATE (PF) 100 MCG/2ML IJ SOLN: 25.0000 ug | INTRAMUSCULAR | Status: DC | PRN

## 2018-02-07 MED ORDER — OXYTOCIN 40 UNITS IN LACTATED RINGERS INFUSION - SIMPLE MED
2.5000 [IU]/h | INTRAVENOUS | Status: DC
  Filled 2018-02-07: qty 1000

## 2018-02-07 MED ORDER — FENTANYL CITRATE (PF) 100 MCG/2ML IJ SOLN
INTRAMUSCULAR | Status: DC | PRN
  Administered 2018-02-07 (×4): 50 ug via INTRAVENOUS

## 2018-02-07 MED ORDER — LACTATED RINGERS IV SOLN
INTRAVENOUS | Status: DC | PRN
  Administered 2018-02-07: 06:00:00 via INTRAVENOUS

## 2018-02-07 MED ORDER — ONDANSETRON HCL 4 MG/2ML IJ SOLN
INTRAMUSCULAR | Status: DC | PRN
  Administered 2018-02-07: 4 mg via INTRAVENOUS

## 2018-02-07 MED ORDER — BUPIVACAINE LIPOSOME 1.3 % IJ SUSP
INTRAMUSCULAR | Status: AC
  Filled 2018-02-07: qty 20

## 2018-02-07 MED ORDER — IBUPROFEN 600 MG PO TABS
600.0000 mg | ORAL_TABLET | Freq: Four times a day (QID) | ORAL | Status: DC
  Administered 2018-02-07: 600 mg via ORAL
  Filled 2018-02-07: qty 1

## 2018-02-07 MED ORDER — ACETAMINOPHEN 500 MG PO TABS
1000.0000 mg | ORAL_TABLET | Freq: Four times a day (QID) | ORAL | Status: DC
  Administered 2018-02-07 – 2018-02-09 (×8): 1000 mg via ORAL
  Filled 2018-02-07 (×8): qty 2

## 2018-02-07 SURGICAL SUPPLY — 39 items
CANISTER SUCT 3000ML PPV (MISCELLANEOUS) ×3 IMPLANT
CLOSURE WOUND 1/2 X4 (GAUZE/BANDAGES/DRESSINGS)
DERMABOND ADVANCED (GAUZE/BANDAGES/DRESSINGS) ×2
DERMABOND ADVANCED .7 DNX12 (GAUZE/BANDAGES/DRESSINGS) ×1 IMPLANT
DRESSING SURGICEL FIBRLLR 1X2 (HEMOSTASIS) ×1 IMPLANT
DRSG SURGICEL FIBRILLAR 1X2 (HEMOSTASIS) ×3
DRSG TELFA 3X8 NADH (GAUZE/BANDAGES/DRESSINGS) IMPLANT
ELECT CAUTERY BLADE 6.4 (BLADE) ×3 IMPLANT
ELECT REM PT RETURN 9FT ADLT (ELECTROSURGICAL) ×3
ELECTRODE REM PT RTRN 9FT ADLT (ELECTROSURGICAL) ×1 IMPLANT
GAUZE 4X4 16PLY RFD (DISPOSABLE) ×3 IMPLANT
GAUZE SPONGE 4X4 12PLY STRL (GAUZE/BANDAGES/DRESSINGS) IMPLANT
GLOVE BIOGEL PI IND STRL 6.5 (GLOVE) ×1 IMPLANT
GLOVE BIOGEL PI IND STRL 7.5 (GLOVE) ×1 IMPLANT
GLOVE BIOGEL PI INDICATOR 6.5 (GLOVE) ×2
GLOVE BIOGEL PI INDICATOR 7.5 (GLOVE) ×2
GLOVE PI ORTHOPRO 6.5 (GLOVE) ×2
GLOVE PI ORTHOPRO STRL 6.5 (GLOVE) ×1 IMPLANT
GLOVE SURG SYN 6.5 ES PF (GLOVE) ×9 IMPLANT
GOWN STRL REUS W/ TWL LRG LVL3 (GOWN DISPOSABLE) ×3 IMPLANT
GOWN STRL REUS W/TWL LRG LVL3 (GOWN DISPOSABLE) ×6
NS IRRIG 1000ML POUR BTL (IV SOLUTION) ×3 IMPLANT
PACK C SECTION AR (MISCELLANEOUS) ×3 IMPLANT
PAD ABD DERMACEA PRESS 5X9 (GAUZE/BANDAGES/DRESSINGS) ×3 IMPLANT
PAD OB MATERNITY 4.3X12.25 (PERSONAL CARE ITEMS) ×6 IMPLANT
PAD PREP 24X41 OB/GYN DISP (PERSONAL CARE ITEMS) ×3 IMPLANT
SPONGE LAP 18X18 RF (DISPOSABLE) ×3 IMPLANT
STRAP SAFETY 5IN WIDE (MISCELLANEOUS) ×3 IMPLANT
STRIP CLOSURE SKIN 1/2X4 (GAUZE/BANDAGES/DRESSINGS) IMPLANT
SUT MNCRL 4-0 (SUTURE) ×2
SUT MNCRL 4-0 27XMFL (SUTURE) ×1
SUT PDS AB 1 TP1 96 (SUTURE) IMPLANT
SUT VIC AB 0 CT1 36 (SUTURE) ×6 IMPLANT
SUT VIC AB 2-0 CT1 27 (SUTURE) ×2
SUT VIC AB 2-0 CT1 TAPERPNT 27 (SUTURE) ×1 IMPLANT
SUT VIC AB 3-0 SH 27 (SUTURE) ×2
SUT VIC AB 3-0 SH 27X BRD (SUTURE) ×1 IMPLANT
SUTURE MNCRL 4-0 27XMF (SUTURE) ×1 IMPLANT
SWABSTK COMLB BENZOIN TINCTURE (MISCELLANEOUS) IMPLANT

## 2018-02-07 NOTE — Discharge Instructions (Signed)

## 2018-02-07 NOTE — Progress Notes (Signed)
Patient ID: Tara RoughenRachel Strickland, female   DOB: [birth year], 27 y.o.   MRN: [id number] Called to review strip as FHR having decel which is taking 9 mins to recover to baseline and pt has made no further progress cervically for many hours. Dr Elesa MassedWard had reviewed the strip and advised LTCS. Disc with pt and family and advised of the risks for infection, bleeding, damage to internal organs, anesthesia risks. Patient and family want to proceed with LTCS.Pitocin has remained off since the decel.  __________________ Myrtie Cruisearon W. Jones,RN, MSN, CNM, FNP Certified Nurse Midwife [facility] OB[facility]N [facility]

## 2018-02-07 NOTE — Lactation Note (Signed)
This note was copied from a baby's chart. Lactation Consultation Note  Patient Name: Tara Cory RoughenRachel Santosuosso ZOXWR'UToday's Date: 02/07/2018     Maternal Data    Feeding Feeding Type: Breast Fed Length of feed: 15 min  LATCH Score                   Interventions    Lactation Tools Discussed/Used     Consult Status  LC to room to ask mother how breastfeeding is going. Mother is currently breastfeeding infant upon arrival to room. Infant has good latch with flanged lips. Mother denies any pain or discomfort and does not have any questions or concerns. LC put contact information on the white board and told her to call if she needed further assistance.    Arlyss Gandylicia Cleopatra Sardo 02/07/2018, 4:14 PM

## 2018-02-07 NOTE — Progress Notes (Signed)
Tara Strickland. Jones, CNM notified of consistent early decelerations and prolong deceleration at 0040. Notified of cervical exam. Notified that patient continues to have early decels with good variability in between. Verbal order to decrease Pitocin to 8 and continue with plan of care. Will notify provider of further changes.

## 2018-02-07 NOTE — Anesthesia Post-op Follow-up Note (Signed)
Anesthesia QCDR form completed.        

## 2018-02-07 NOTE — Care Management (Signed)
She returns for a C-section. Epidural working well.

## 2018-02-07 NOTE — Progress Notes (Signed)
Notified of prolonged deceleration by another provider on the unit.  Came to bedside at 02:03.   Fetal hearts were in the 90s x 6 minutes with nadir of 60 upon my arrival.  2 nurses at bedside and a CNM from a different practice.  Last check was reported as 6.5/90/-1 by RN Pitocin had been discontinued, O2 applied, and position changes were occurring.    VS were WNL and stable. See flowsheet. (Epic was down during this event)  Upon my arrival fetal hearts were recovering to baseline ~120-130s. On my exam, she has a stretchy/floppy cervix, able to dilate to 6cm, 80%, and -1 to -2.  The fetal head is prominently anterior and behind the pubic bone.  There is ample room posteriorly.  OP.  +acceleration with scalp stim after prolonged decel.   With pitocin on, the fetal strip shows persistent variables with contractions.  These have been noted in previous notes as "early" decelerations but they are not. They are variables with slow resolution that happen with each contraction.  There had been no spontaneous accelerations with regularity since ~23:00 and none since ~00:45.   Continued close observation of fetal strip for the next 45 mins in room, with recovery of fetal heart rate.  Continues to have variables with contractions but not as deep and moderate variability in between.  Contracting spontaneously q2-644mins.  Changed position to apply counterpressure to attempt to have fetal head move beyond pubic bone. Placed in "Spiderman" position - left sims. Has good working epidural.   For now ok to watch with pitocin off - she is contracting regularly on her own - and assess for both cervical change, descent of the fetal head beyond the pubic bone, and close monitoring of the fetal strip.      We discussed the concerns and interventions with patient and her family at bedside.  Cesarean will remain a possibility should there be signs of fetal (or maternal) compromise or if there is inability to progress in  labor, with or without augmentation.  A/P: 27yo G2P1001 @ 40+4 with PROM, TOLAC, and persistent variables   1. IUP: category 2 tracing, appropriate external monitoring.  Interventions PRN.   2. Labor: s/p pitocin augmentation, stopped with prolonged deceleration.  Continue off for now and observe for cervical change without pit.   IF in the next hour there is cervical change, then continue without restarting pitocin.   IF in the next hour the fetal strip does not show recurrent deep variables with contractions or other evidence of compromise, then we can restart pitocin at 2.   I will review the strip prior to restarting pitocin.   ----- Ranae Plumberhelsea Merari Pion, MD Attending Obstetrician and Gynecologist [facility], Department of OB/GYN [facility]

## 2018-02-07 NOTE — Op Note (Signed)
Cesarean Section Procedure Note  02/07/2018   Patient:  Tara Strickland  27 y.o. female at [redacted]w[redacted]d.  Patient's last menstrual period was 04/29/2017. Preoperative diagnosis:  previous c-section, failed tolac Postoperative diagnosis:  previous c-section, failed tolac  PROCEDURE:  Procedure(s): repeat CESAREAN SECTION (N/A) Surgeon:  Surgeon(s) and Role:    * Ladine Kiper, Elenora Fender, MD - Primary Milon Score, CNM assisting Anesthesia:  epidural I/O: Total I/O In: 400 [I.V.:400] Out: 350 [Blood:350] Specimens: none Complications: None Apparent Disposition:  VS stable to PACU  Findings: normal uterus, tubes and ovaries bilaterally Live born female  Birth Weight: 7 lb 8.6 oz (3420 g) APGAR: 8, 9 Tight double nuchal cord Occiput posterior  Newborn Delivery   Birth date/time:  02/07/2018 06:28:00 Delivery type:  C-Section, Low Transverse Trial of labor:  Yes C-section categorization:  Repeat      Indication for procedure: 27 y.o. female at [redacted]w[redacted]d who presented with PROM and history of cesarean for fetal intolerance of labor and requested trial of labor after cesarean (TOLAC).  She was observed for ~12hrs and had made cervical change to 5cm.  Contractions spaced out and was augmented with pitocin.  With each contraction there was a variable deceleration with prolonged recovery.  There were a few prolonged decels >20mins with recovery.  She did not make change on her own and we could not continue pitocin with her deceleration /category 2 strip, thus repeat cesarean was recommended and agreed upon.   Procedure Details   The risks, benefits, complications, treatment options, and expected outcomes were discussed with the patient. Informed consent was obtained. The patient was taken to Operating Room, identified as Tara Strickland and the procedure verified as a cesarean delivery.   After administration of anesthesia, the patient was prepped and draped in the usual sterile manner, including a vaginal  prep. A surgical time out was performed, with the pediatric team present. After confirming adequate anesthesia, a Pfannenstiel incision was made and carried down through the subcutaneous tissue to the fascia. Fascial incision was made and extended transversely. The fascia was separated from the underlying rectus tissue superiorly and inferiorly. The rectus muscles were divided in the midline. The peritoneum was identified and entered. Peritoneal incision was extended longitudinally.  A low transverse uterine incision was made. Delivered from OP, cephalic presentation was a live born female. Nuchal x2 reduced prior to delivery of shoulders. Delayed cord clamping was performed for 30 seconds, . The umbilical cord was doubly clamped and cut, and the baby was handed off to the awaitng pediatrician.  Cord blood was obtained for evaluation. The placenta was removed intact and appeared normal. The uterus was delivered from the abdominal cavity and cleared of clots, membranes, and debris. The uterus, tubes and ovaries appeared normal. The uterine incision was closed with running locking sutures of 0 Vicryl, and then a second, imbricating stitch was placed. Hemostasis was observed. The abdominal cavity was evacuated of extraneous fluid. The uterus was returned to the abdominal cavity and again the incision was inspected for hemostasis, which was confirmed.  The paracolic gutters were cleaned. The fascia was then reapproximated with running suture of vicryl. 80cc of Long- and short-acting bupivicaine was injected circumferentially into the fascia.  After a change of gloves, the subcutaneous tissue was irrigated and reapproximated with 3-0 vicryl. The skin was closed with 4-0 Monocryl and 20cc of long- and short-acting bupivacaine injected into the skin and subcutaneous tissues.  The incision was covered with surgical glue.    An  abdominal binder was placed  Instrument, sponge, and needle counts were correct prior the  abdominal closure and at the conclusion of the case.   I was present and performed this procedure in its entirety.  VTE: SCDs Perioperative antibiotics: Ancef 2g, azithromycin 500mg   ----- Ranae Plumberhelsea Yailin Biederman, MD Attending Obstetrician and Gynecologist [facility], Department of OB/GYN [facility]

## 2018-02-07 NOTE — Transfer of Care (Signed)
Immediate Anesthesia Transfer of Care Note  Patient: Tara RoughenRachel Starkes  Procedure(s) Performed: repeat CESAREAN SECTION (N/A Abdomen)  Patient Location: PACU  Anesthesia Type:Epidural  Level of Consciousness: awake, alert , oriented and patient cooperative  Airway & Oxygen Therapy: Patient Spontanous Breathing  Post-op Assessment: Report given to RN, Post -op Vital signs reviewed and stable and Patient moving all extremities  Post vital signs: Reviewed and stable  Last Vitals:  Vitals Value Taken Time  BP 119/82 02/07/2018  7:51 AM  Temp 36.3 C 02/07/2018  7:51 AM  Pulse 73 02/07/2018  7:51 AM  Resp 13 02/07/2018  7:51 AM  SpO2 98 % 02/07/2018  7:51 AM    Last Pain:  Vitals:   02/07/18 0751  TempSrc: Oral  PainSc: 0-No pain         Complications: No apparent anesthesia complications

## 2018-02-07 NOTE — Discharge Summary (Signed)
Obstetrical Discharge Summary  Patient Name: Tara RoughenRachel Strickland DOB: 1990/10/22 MRN: [id number]  Date of Admission: 02/06/2018 Date of Delivery: 02/07/18 Delivered by: Ranae Plumberhelsea Ward, MD Date of Discharge: 02/09/2018  [facility]  WUJ:[id number]'WLMP:Patient's last menstrual period was 04/29/2017. EDC Estimated Date of Delivery: 02/03/18 Gestational Age at Delivery: 2537w4d   Antepartum complications:  1. Prior cesarean 2. Obesity - BMI 33  Admitting Diagnosis: rupture of membranes, history of cesarean currently pregnant, trial of labor after cesarean (TOLAC)  Secondary Diagnosis: Patient Active Problem List   Diagnosis Date Noted  . Indication for care in labor and delivery, antepartum 02/06/2018  . Indication for care in labor or delivery 02/06/2018  . S/P cesarean section 01/29/2015  . Normal labor and delivery 01/27/2015  . Encounter for routine screening for malformation using ultrasonics   . [redacted] weeks gestation of pregnancy   . Chlamydial cervicitis 09/02/2014  . Bacterial vaginosis 09/02/2014  . Normal first pregnancy confirmed, antepartum 08/04/2014    Augmentation: Pitocin and Foley Balloon Complications: None Intrapartum complications/course: patient admitted with PROM and made cervical change after several hours.  Category 2 tracing. Pitocin augmentation was intermittent due to prolonged decelerations.  Cesarean called due to fetal intolerance of labor.  Cesarean uncomplicated.  Tight nuchal x2. Date of Delivery: 02/07/18 Delivered By: Leeroy Bockhelsea Ward Delivery Type: repeat cesarean section, low transverse incision Anesthesia: epidural Placenta: expressed Laceration:  n/a Episiotomy: none Newborn Data: Live born female  Birth Weight: 7 lb 8.6 oz (3420 g) APGAR: 8, 9  Newborn Delivery   Birth date/time:  02/07/2018 06:28:00 Delivery type:  C-Section, Low Transverse Trial of labor:  Yes C-section categorization:  Repeat     Postpartum Procedures: none  Post partum  course:  Patient had an uncomplicated postpartum course.  By time of discharge on POD#2, her pain was controlled on oral pain medications; she had appropriate lochia and was ambulating, voiding without difficulty, tolerating regular diet and passing flatus.   She was deemed stable for discharge to home.    Discharge Physical Exam:  BP 115/63 (BP Location: Right Arm)   Pulse 62   Temp 98.1 F (36.7 C) (Oral)   Resp 18   Ht 5\' 3"  (1.6 m)   Wt 86.2 kg   LMP 04/29/2017   SpO2 97%   Breastfeeding? Unknown   BMI 33.66 kg/m   General: NAD CV: RRR Pulm: CTABL, nl effort ABD: s/nd/nt, fundus firm and below the umbilicus Lochia: moderate Incision: c/d/i DVT Evaluation: LE non-ttp, no evidence of DVT on exam.  Hemoglobin  Date Value Ref Range Status  02/08/2018 11.4 (L) 12.0 - 16.0 g/dL Final   HCT  Date Value Ref Range Status  02/08/2018 33.0 (L) 35.0 - 47.0 % Final     Disposition: stable, discharge to home. Baby Feeding: breastmilk Baby Disposition: home with mom  Rh Immune globulin given: n/a Rubella vaccine given: n/a Tdap vaccine given in AP or PP setting: AP Flu vaccine given in AP or PP setting: n/a  Contraception: undecided  Prenatal Labs:  ABO, Rh:   B pos Antibody:  Neg Rubella:  Immune RPR:   NR HBsAg:   Neg HIV:   Neg GBS:   Pos Varicella: Immune   Plan:  Tara Strickland was discharged to home in good condition. Follow-up appointment with Dr. Elesa MassedWard in 2 weeks.  Discharge Medications: Allergies as of 02/09/2018   No Known Allergies     Medication List    TAKE these medications   acetaminophen  500 MG tablet Commonly known as:  TYLENOL Take 2 tablets (1,000 mg total) by mouth every 6 (six) hours.   ibuprofen 600 MG tablet Commonly known as:  ADVIL,MOTRIN Take 1 tablet (600 mg total) by mouth every 6 (six) hours.   OB COMPLETE PETITE 35-5-1-200 MG Caps Take 1 capsule by mouth daily.   oxyCODONE 5 MG immediate release tablet Commonly known  as:  Oxy IR/ROXICODONE Take 1 tablet (5 mg total) by mouth every 4 (four) hours as needed (pain scale 4-7).   senna-docusate 8.6-50 MG tablet Commonly known as:  Senokot-S Take 2 tablets by mouth daily.       Follow-up Information    Ward, Elenora Fender, MD Follow up in 2 week(s).   Specialty:  Obstetrics and Gynecology Contact information: [address] [place] [address] [phone]           Signed: Randa Ngo, CNM 02/09/2018 10:12 AM

## 2018-02-08 ENCOUNTER — Encounter: Payer: Self-pay | Admitting: Obstetrics & Gynecology

## 2018-02-08 ENCOUNTER — Inpatient Hospital Stay: Admission: RE | Admit: 2018-02-08 | Payer: Self-pay | Source: Ambulatory Visit

## 2018-02-08 ENCOUNTER — Other Ambulatory Visit: Payer: Self-pay

## 2018-02-08 LAB — CBC
HCT: 33 % — ABNORMAL LOW (ref 35.0–47.0)
HEMOGLOBIN: 11.4 g/dL — AB (ref 12.0–16.0)
MCH: 31.3 pg (ref 26.0–34.0)
MCHC: 34.4 g/dL (ref 32.0–36.0)
MCV: 91.1 fL (ref 80.0–100.0)
Platelets: 267 10*3/uL (ref 150–440)
RBC: 3.63 MIL/uL — ABNORMAL LOW (ref 3.80–5.20)
RDW: 14.2 % (ref 11.5–14.5)
WBC: 11.4 10*3/uL — AB (ref 3.6–11.0)

## 2018-02-08 NOTE — Anesthesia Postprocedure Evaluation (Signed)
Anesthesia Post Note  Patient: Tara Strickland  Procedure(s) Performed: repeat CESAREAN SECTION (N/A Abdomen)  Patient location during evaluation: Mother Baby Anesthesia Type: Epidural Level of consciousness: oriented and awake and alert Pain management: pain level controlled Vital Signs Assessment: post-procedure vital signs reviewed and stable Respiratory status: spontaneous breathing Cardiovascular status: blood pressure returned to baseline and stable Postop Assessment: no headache, no backache, no apparent nausea or vomiting, patient able to bend at knees and able to ambulate Anesthetic complications: no     Last Vitals:  Vitals:   02/08/18 0036 02/08/18 0432  BP: (!) 96/52 112/66  Pulse: 66 62  Resp:    Temp: 36.7 C 36.6 C  SpO2: 97% 99%    Last Pain:  Vitals:   02/08/18 0432  TempSrc: Oral  PainSc:                  Cleda MccreedyJoseph K Geneva Pallas

## 2018-02-08 NOTE — Lactation Note (Signed)
This note was copied from a baby's chart. Lactation Consultation Note  Patient Name: Tara Cory RoughenRachel Sallade AOZHY'QToday's Date: 02/08/2018 Reason for consult: Follow-up assessment I did not observe a feeding Maternal Data Formula Feeding for Exclusion: No Does the patient have breastfeeding experience prior to this delivery?: Yes  Feeding Feeding Type: Breast Fed Length of feed: 5 min  LATCH Score Latch: (feeding not observed)                 Interventions    Lactation Tools Discussed/Used WIC Program: Yes   Consult Status Consult Status: PRN Encouraged mom to breastfeed every 2-3 hrs and that breastfeeding often makes milk, mom concerned about making enough milk,  Had lots of milk with previous birth   Dyann KiefMarsha D Jordanny Waddington 02/08/2018, 5:06 PM

## 2018-02-08 NOTE — Addendum Note (Signed)
Addendum  created 02/08/18 1034 by Stormy Fabianurtis, Jordan Pardini, CRNA   Intraprocedure Event edited

## 2018-02-08 NOTE — Anesthesia Post-op Follow-up Note (Signed)
  Anesthesia Pain Follow-up Note  Patient: Tara RoughenRachel Strickland  Day #: 1  Date of Follow-up: 02/08/2018 Time: 7:40 AM  Last Vitals:  Vitals:   02/08/18 0036 02/08/18 0432  BP: (!) 96/52 112/66  Pulse: 66 62  Resp:    Temp: 36.7 C 36.6 C  SpO2: 97% 99%    Level of Consciousness: alert  Pain: mild   Side Effects:None  Catheter Site Exam:clean, dry     Plan: D/C from anesthesia care at surgeon's request  Cleda MccreedyJoseph K Elaiza Shoberg

## 2018-02-09 MED ORDER — SENNOSIDES-DOCUSATE SODIUM 8.6-50 MG PO TABS: 2.0000 | ORAL_TABLET | ORAL | 1 refills | Status: DC

## 2018-02-09 MED ORDER — ACETAMINOPHEN 500 MG PO TABS: 1000.0000 mg | ORAL_TABLET | Freq: Four times a day (QID) | ORAL | 1 refills | Status: DC

## 2018-02-09 MED ORDER — IBUPROFEN 600 MG PO TABS: 600.0000 mg | ORAL_TABLET | Freq: Four times a day (QID) | ORAL | 1 refills | Status: DC

## 2018-02-09 MED ORDER — OXYCODONE HCL 5 MG PO TABS: 5.0000 mg | ORAL_TABLET | ORAL | 0 refills | Status: DC | PRN

## 2018-02-09 NOTE — Progress Notes (Signed)
  Subjective:   Doing well.  No complaints.  Voiding, ambulating, tolerating regular PO diet, tolerating pain with PO meds. Denies: CP SOB F/C, N/V, calf pain   Breastfeeding currently  Objective:  Blood pressure 123/74, pulse 88, temperature 98.3 F (36.8 C), temperature source Oral, resp. rate 20, height 5\' 3"  (1.6 m), weight 86.2 kg, last menstrual period 04/29/2017, SpO2 98 %, unknown if currently breastfeeding.  General: NAD Pulmonary: no increased work of breathing Abdomen: non-distended, non-tender, fundus firm at level of umbilicus Incision: c/d/i covered in glue Extremities: no edema, no erythema, no tenderness  Results for orders placed or performed during the hospital encounter of 02/06/18 (from the past 24 hour(s))  CBC     Status: Abnormal   Collection Time: 02/08/18  5:26 AM  Result Value Ref Range   WBC 11.4 (H) 3.6 - 11.0 K/uL   RBC 3.63 (L) 3.80 - 5.20 MIL/uL   Hemoglobin 11.4 (L) 12.0 - 16.0 g/dL   HCT 45.433.0 (L) 09.835.0 - 11.947.0 %   MCV 91.1 80.0 - 100.0 fL   MCH 31.3 26.0 - 34.0 pg   MCHC 34.4 32.0 - 36.0 g/dL   RDW 14.714.2 82.911.5 - 56.214.5 %   Platelets 267 150 - 440 K/uL   @I /O24@   Assessment:   27 y.o. [id number] postoperativeday # 1   Plan:  1) Acute blood loss anemia - hemodynamically stable and asymptomatic.  2) Postop: doing well, ambulating and voiding, continue routine care.  3) Lactation - encourage breastfeeding and give support.  4) dispo:  Continue inpatient admission; anticipate d/c tomorrow.  ----- Ranae Plumberhelsea Aivan Fillingim, MD Attending Obstetrician and Gynecologist [facility], Department of OB/GYN [facility]

## 2018-02-09 NOTE — Progress Notes (Signed)
Reviewed D/C instructions with pt and family. Pt verbalized understanding of teaching. Discharged to home via W/C. Pt to schedule f/u appt.  

## 2018-02-09 NOTE — Progress Notes (Signed)
Post Partum Day 2 Subjective: Doing well, no complaints.  Tolerating regular diet, pain with PO meds, voiding and ambulating without difficulty.  No CP SOB Fever,Chills, N/V or leg pain; denies nipple or breast pain; no HA change of vision, RUQ/epigastric pain  Objective: BP 115/63 (BP Location: Right Arm)   Pulse 62   Temp 98.1 F (36.7 C) (Oral)   Resp 18   Ht 5\' 3"  (1.6 m)   Wt 86.2 kg   LMP 04/29/2017   SpO2 97%   Breastfeeding? Unknown   BMI 33.66 kg/m    Physical Exam:  General: NAD Breasts: soft/nontender CV: RRR Pulm: nl effort, CTABL Abdomen: soft, NT, BS x 4 Incision: Medical glue CDI/no erythema or drainage Lochia: small Uterine Fundus: fundus firm and 2 fb below umbilicus DVT Evaluation: no cords, ttp LEs   Recent Labs    02/06/18 1125 02/08/18 0526  HGB 13.2 11.4*  HCT 39.0 33.0*  WBC 12.7* 11.4*  PLT 330 267    Assessment/Plan: 27 y.o. [id number] postpartum day # 2  - Continue routine PP care - Lactation consult prn- breastfeeding going well.   - Discussed contraceptive options including implant, IUDs hormonal and non-hormonal, injection, pills/ring/patch, condoms, and NFP. Pt remains undecided.  - Mild blood loss anemia- continue prenatal and iron supplement.  - Immunization status:  all Imms up to date    Disposition: Does desire Dc home today.     McVey, REBECCA A, CNM 02/09/2018  10:11 AM

## 2018-02-09 NOTE — Lactation Note (Signed)
This note was copied from a baby's chart. Lactation Consultation Note  Patient Name: Boy Cory RoughenRachel Kabat UJWJX'BToday's Date: 02/09/2018 Reason for consult: Follow-up assessment;Term  Observed excellent breast feed on left breast in cross cradle hold independently.  Mom reports her mom breast fed 9 children without any problems.  She breast fed her last baby for 5 months without any challenges.  Mom reports slight nipple tenderness, but reports coconut oil seems to be helping that some.  Mom plans to be discharged home shortly.  Reviewed supply and demand, normal course of lactation and routine newborn feeding patterns.  Lactation name and number on white board and encouraged to call with any questions, concerns or assistance.  Reviewed community resources for any follow up including mom's express support club. Maternal Data Formula Feeding for Exclusion: No Has patient been taught Hand Expression?: Yes Does the patient have breastfeeding experience prior to this delivery?: Yes  Feeding Feeding Type: Breast Fed Length of feed: 15 min  LATCH Score Latch: Grasps breast easily, tongue down, lips flanged, rhythmical sucking.  Audible Swallowing: Spontaneous and intermittent  Type of Nipple: Everted at rest and after stimulation  Comfort (Breast/Nipple): Filling, red/small blisters or bruises, mild/mod discomfort  Hold (Positioning): No assistance needed to correctly position infant at breast.  LATCH Score: 9  Interventions Interventions: Breast feeding basics reviewed;Coconut oil;Support pillows  Lactation Tools Discussed/Used Tools: Coconut oil WIC Program: Yes   Consult Status Consult Status: PRN    Louis MeckelWilliams, Germaine Shenker Kay 02/09/2018, 3:30 PM

## 2018-02-11 ENCOUNTER — Encounter: Admission: RE | Payer: Self-pay | Source: Ambulatory Visit

## 2018-02-11 ENCOUNTER — Inpatient Hospital Stay
Admission: RE | Admit: 2018-02-11 | Payer: Medicaid Other | Source: Ambulatory Visit | Admitting: Obstetrics & Gynecology

## 2018-02-11 SURGERY — Surgical Case
Anesthesia: Spinal

## 2018-08-07 NOTE — Telephone Encounter (Signed)
Error

## 2019-05-28 LAB — OB RESULTS CONSOLE GC/CHLAMYDIA
Chlamydia: NEGATIVE
Gonorrhea: NEGATIVE

## 2019-05-28 LAB — OB RESULTS CONSOLE VARICELLA ZOSTER ANTIBODY, IGG: Varicella: IMMUNE

## 2019-05-28 LAB — OB RESULTS CONSOLE HEPATITIS B SURFACE ANTIGEN: Hepatitis B Surface Ag: NEGATIVE

## 2019-05-28 LAB — OB RESULTS CONSOLE RUBELLA ANTIBODY, IGM: Rubella: IMMUNE

## 2019-10-28 LAB — OB RESULTS CONSOLE RPR: RPR: NONREACTIVE

## 2019-10-28 LAB — OB RESULTS CONSOLE HIV ANTIBODY (ROUTINE TESTING): HIV: NONREACTIVE

## 2019-11-12 NOTE — H&P (Signed)
Tara Strickland is a 29 y.o. female presenting for elective repeat LTCS and BTL on 11/24/19 EDC 11/26/19. 2 prior c/s  OB History    Gravida  2   Para  2   Term  2   Preterm      AB      Living  2     SAB      TAB      Ectopic      Multiple  0   Live Births  2          Past Medical History:  Diagnosis Date  . Medical history non-contributory    Past Surgical History:  Procedure Laterality Date  . CESAREAN SECTION N/A 01/29/2015   Procedure: CESAREAN SECTION;  Surgeon: Kathreen Cosier, MD;  Location: [facility];  Service: Obstetrics;  Laterality: N/A;  . CESAREAN SECTION N/A 02/07/2018   Procedure: repeat CESAREAN SECTION;  Surgeon: Ward, Elenora Fender, MD;  Location: [facility] ORS;  Service: Obstetrics;  Laterality: N/A;  . NO PAST SURGERIES     Family History: family history includes Alzheimer's disease in her paternal grandmother; Cancer in her maternal grandfather and maternal grandmother; Hypertension in her father and paternal grandfather. Social History:  reports that she has never smoked. She has never used smokeless tobacco. She reports that she does not drink alcohol or use drugs.     Maternal Diabetes: No Genetic Screening: Declined Maternal Ultrasounds/Referrals: Normal Fetal Ultrasounds or other Referrals:  None Maternal Substance Abuse:  No Significant Maternal Medications:  None Significant Maternal Lab Results:  Group B Strep positive UTI  Other Comments:  None  Review of Systems History   unknown if currently breastfeeding. Exam Physical Exam   Lungs CTA   CV RRR  abd : gravid  Prenatal labs: ABO, Rh:  B+ Antibody:  negative Rubella:  Imm , VAricella Imm  RPR:   NR HBsAg:   neg HIV:   NEg  GBS:   Positive on UA culture   Assessment/Plan: Elective repeat LTCS and BTL  11/24/19 The patient has been counseled regarding the risks of the procedure including  organ injury , blood transfusion , infection , DVT . All questions answered .    Ihor Austin  Jaicee Michelotti 11/12/2019, 5:52 PM

## 2019-11-20 ENCOUNTER — Encounter: Payer: Self-pay | Admitting: *Deleted

## 2019-11-20 ENCOUNTER — Encounter
Admission: RE | Admit: 2019-11-20 | Discharge: 2019-11-20 | Disposition: A | Discharge status: Home / Self Care | Payer: Medicaid Other | Source: Ambulatory Visit | Attending: Obstetrics and Gynecology | Admitting: Obstetrics and Gynecology

## 2019-11-20 ENCOUNTER — Other Ambulatory Visit: Payer: Self-pay

## 2019-11-20 DIAGNOSIS — Z01818 Encounter for other preprocedural examination: Secondary | ICD-10-CM | POA: Diagnosis not present

## 2019-11-20 HISTORY — DX: Gastro-esophageal reflux disease without esophagitis: K21.9

## 2019-11-20 NOTE — Patient Instructions (Signed)
INSTRUCTIONS FOR SURGERY     Your surgery is scheduled for:   June 7TH, 2021     PLEASE CONTACT LABOR AND DELIVERY AT [phone] ON Friday, June 4TH       TO CONFIRM YOUR ARRIVAL TIME FOR YOUR C SECTION.      When you arrive for surgery, report to [place].      Check in with Registration and they will have someone escort you to L & D.      You and Tara Strickland must have a mask on at all times while in common spaces and when        There is a care giver in your room.    REMEMBER: Instructions that are not followed completely may result in serious medical risk,  up to and including death, or upon the discretion of your surgeon and anesthesiologist,            your surgery may need to be rescheduled.  __X__ 1. Do not eat food after midnight the night before your procedure.                    No gum, candy, lozenger, tic tacs, tums or hard candies.                  ABSOLUTELY NOTHING SOLID IN YOUR MOUTH AFTER MIDNIGHT                    You may drink unlimited clear liquids up to 2 hours before you are scheduled to arrive for surgery.                   Do not drink anything within those 2 hours unless you need to take medicine, then take the                   smallest amount you need.  Clear liquids include:  water, apple juice without pulp,                   any flavor Gatorade, Black coffee, black tea.  Sugar may be added but no dairy/ honey /lemon.                        Broth and jello is not considered a clear liquid.  __x__  2. On the morning of surgery, please brush your teeth with toothpaste and water. You may rinse with                  mouthwash if you wish but DO NOT SWALLOW TOOTHPASTE OR MOUTHWASH  __X___3. NO alcohol for 24 hours before or after surgery.  __x___ 4.  Do NOT smoke or use e-cigarettes for 24 HOURS PRIOR TO SURGERY.                      DO NOT Use any chewable tobacco products for at least 6 hours prior to  surgery.  __x___ 5. If you start any new medication after this appointment and prior to surgery, please  Bring it with you on the day of surgery.  ___x__ 6. Notify your doctor if there is any change in your medical condition, such as fever,                  infection, vomitting, diarrhea or any open sores.  __x___ 7.  USE the CHG SOAP as instructed, the night before surgery and the day of surgery.                   Once you have washed with this soap, do NOT use any of the following: Powders, perfumes                    or lotions. Please do not wear make up, hairpins, clips or nail polish. You MAY wear deodorant.                   Men may shave their face and neck.  Women need to shave 48 hours prior to surgery.                   DO NOT wear ANY jewelry on the day of surgery. If there are rings that are too tight to                    remove easily, please address this prior to the surgery day. Piercings need to be removed.                                                                     NO METAL ON YOUR BODY.                    Do NOT bring any valuables.  If you came to Pre-Admit testing then you will not need license,                     insurance card or credit card.  If you will be staying overnight, please either leave your things in                     the car or have your family be responsible for these items.                     [facility] IS NOT RESPONSIBLE FOR BELONGINGS OR VALUABLES.  ___X__ 8. DO NOT wear contact lenses on surgery day.  You may not have dentures,                     Hearing aides, contacts or glasses in the operating room. These items can be                    Placed in the Recovery Room to receive immediately after surgery.  __x___ 9. IF YOU ARE SCHEDULED TO GO HOME ON THE SAME DAY, YOU MUST                   Have someone to drive you home and to stay with you  for the first 24 hours.  Have an arrangement prior to  arriving on surgery day.  ___x__ 10. Take the following medications on the morning of surgery with a sip of water:                              1.  nothing                     2.                      _____ 11.  Follow any instructions provided to you by your surgeon.                        Such as enema, clear liquid bowel prep  __X__  12. STOP ALL ASPIRIN PRODUCTS NOW.  (6//21)                      THIS INCLUDES BC POWDERS / GOODIES POWDER  __x___ 13. STOP Anti-inflammatories as of: NOW  (11/20/19)                      This includes IBUPROFEN / MOTRIN / ADVIL / ALEVE/ NAPROXYN                    YOU MAY TAKE TYLENOL ANY TIME PRIOR TO SURGERY.  _____ 14.  You may continue taking PRENATAL VITAMINS,                             but do not take on the morning of surgery.  ______18. If staying overnight, please have appropriate shoes to wear to be able to walk around the unit.                   Wear clean and comfortable clothing to the hospital.  REMEMBER TO BRING A CELL PHONE CHARGER IF YOU BRING YOUR PHONE. HAVE A LIST OF PHONE NUMBERS FOR YOUR CONTACT PEOPLE.  ENJOY!!  GOOD LUCK!!

## 2019-11-21 ENCOUNTER — Other Ambulatory Visit
Admission: RE | Admit: 2019-11-21 | Discharge: 2019-11-21 | Disposition: A | Discharge status: Home / Self Care | Payer: Medicaid Other | Source: Ambulatory Visit | Attending: Obstetrics and Gynecology | Admitting: Obstetrics and Gynecology

## 2019-11-21 DIAGNOSIS — Z20822 Contact with and (suspected) exposure to covid-19: Secondary | ICD-10-CM | POA: Diagnosis not present

## 2019-11-21 DIAGNOSIS — Z01812 Encounter for preprocedural laboratory examination: Secondary | ICD-10-CM | POA: Diagnosis not present

## 2019-11-21 LAB — CBC
HCT: 34.9 % — ABNORMAL LOW (ref 36.0–46.0)
Hemoglobin: 11.7 g/dL — ABNORMAL LOW (ref 12.0–15.0)
MCH: 29.8 pg (ref 26.0–34.0)
MCHC: 33.5 g/dL (ref 30.0–36.0)
MCV: 89 fL (ref 80.0–100.0)
Platelets: 277 10*3/uL (ref 150–400)
RBC: 3.92 MIL/uL (ref 3.87–5.11)
RDW: 13.6 % (ref 11.5–15.5)
WBC: 9.6 10*3/uL (ref 4.0–10.5)
nRBC: 0 % (ref 0.0–0.2)

## 2019-11-21 LAB — BASIC METABOLIC PANEL
Anion gap: 8 (ref 5–15)
BUN: <5 mg/dL — ABNORMAL LOW (ref 6–20)
CO2: 22 mmol/L (ref 22–32)
Calcium: 8.6 mg/dL — ABNORMAL LOW (ref 8.9–10.3)
Chloride: 105 mmol/L (ref 98–111)
Creatinine, Ser: 0.51 mg/dL (ref 0.44–1.00)
GFR calc Af Amer: >60 mL/min (ref >60)
GFR calc non Af Amer: >60 mL/min (ref >60)
Glucose, Bld: 95 mg/dL (ref 70–99)
Potassium: 3.7 mmol/L (ref 3.5–5.1)
Sodium: 135 mmol/L (ref 135–145)

## 2019-11-22 LAB — SARS CORONAVIRUS 2 (TAT 6-24 HRS): SARS Coronavirus 2: NEGATIVE

## 2019-11-24 ENCOUNTER — Inpatient Hospital Stay
Admission: RE | Admit: 2019-11-24 | Discharge: 2019-11-26 | DRG: 784 | Disposition: A | Discharge status: Home / Self Care | Payer: Medicaid Other | Attending: Obstetrics and Gynecology | Admitting: Obstetrics and Gynecology

## 2019-11-24 ENCOUNTER — Encounter
Admission: RE | Disposition: A | Discharge status: Home / Self Care | Payer: Self-pay | Source: Home / Self Care | Attending: Obstetrics and Gynecology

## 2019-11-24 ENCOUNTER — Inpatient Hospital Stay: Payer: Medicaid Other | Admitting: Certified Registered"

## 2019-11-24 ENCOUNTER — Other Ambulatory Visit: Payer: Self-pay

## 2019-11-24 ENCOUNTER — Encounter: Payer: Self-pay | Admitting: Obstetrics and Gynecology

## 2019-11-24 DIAGNOSIS — O34211 Maternal care for low transverse scar from previous cesarean delivery: Principal | ICD-10-CM | POA: Diagnosis present

## 2019-11-24 DIAGNOSIS — O34219 Maternal care for unspecified type scar from previous cesarean delivery: Secondary | ICD-10-CM | POA: Diagnosis present

## 2019-11-24 DIAGNOSIS — O99824 Streptococcus B carrier state complicating childbirth: Secondary | ICD-10-CM | POA: Diagnosis present

## 2019-11-24 DIAGNOSIS — Z302 Encounter for sterilization: Secondary | ICD-10-CM | POA: Diagnosis not present

## 2019-11-24 DIAGNOSIS — Z9889 Other specified postprocedural states: Secondary | ICD-10-CM

## 2019-11-24 DIAGNOSIS — Z3A39 39 weeks gestation of pregnancy: Secondary | ICD-10-CM

## 2019-11-24 DIAGNOSIS — O9081 Anemia of the puerperium: Secondary | ICD-10-CM | POA: Diagnosis not present

## 2019-11-24 DIAGNOSIS — D62 Acute posthemorrhagic anemia: Secondary | ICD-10-CM | POA: Diagnosis not present

## 2019-11-24 LAB — URINE DRUG SCREEN, QUALITATIVE (ARMC ONLY)
Amphetamines, Ur Screen: NOT DETECTED
Barbiturates, Ur Screen: NOT DETECTED
Benzodiazepine, Ur Scrn: NOT DETECTED
Cannabinoid 50 Ng, Ur ~~LOC~~: NOT DETECTED
Cocaine Metabolite,Ur ~~LOC~~: NOT DETECTED
MDMA (Ecstasy)Ur Screen: NOT DETECTED
Methadone Scn, Ur: NOT DETECTED
Opiate, Ur Screen: NOT DETECTED
Phencyclidine (PCP) Ur S: NOT DETECTED
Tricyclic, Ur Screen: NOT DETECTED

## 2019-11-24 LAB — TYPE AND SCREEN
ABO/RH(D): B POS
ABO/RH(D): B POS
Antibody Screen: NEGATIVE
Antibody Screen: NEGATIVE
Extend sample reason: UNDETERMINED

## 2019-11-24 SURGERY — Surgical Case
Anesthesia: Spinal | Laterality: Bilateral

## 2019-11-24 MED ORDER — CEFAZOLIN SODIUM-DEXTROSE 2-4 GM/100ML-% IV SOLN
2.0000 g | INTRAVENOUS | Status: AC
  Administered 2019-11-24: 2 g via INTRAVENOUS
  Filled 2019-11-24: qty 100

## 2019-11-24 MED ORDER — SOD CITRATE-CITRIC ACID 500-334 MG/5ML PO SOLN
30.0000 mL | ORAL | Status: AC
  Administered 2019-11-24: 30 mL via ORAL
  Filled 2019-11-24: qty 30

## 2019-11-24 MED ORDER — CHLORHEXIDINE GLUCONATE 0.12 % MT SOLN
15.0000 mL | Freq: Once | OROMUCOSAL | Status: DC
  Filled 2019-11-24: qty 15

## 2019-11-24 MED ORDER — ZOLPIDEM TARTRATE 5 MG PO TABS: 5.0000 mg | ORAL_TABLET | Freq: Every evening | ORAL | Status: DC | PRN

## 2019-11-24 MED ORDER — SODIUM CHLORIDE 0.9% FLUSH: 3.0000 mL | INTRAVENOUS | Status: DC | PRN

## 2019-11-24 MED ORDER — OXYCODONE HCL 5 MG PO TABS: 10.0000 mg | ORAL_TABLET | ORAL | Status: DC | PRN

## 2019-11-24 MED ORDER — ONDANSETRON HCL 4 MG/2ML IJ SOLN
4.0000 mg | Freq: Three times a day (TID) | INTRAMUSCULAR | Status: DC | PRN
  Administered 2019-11-24: 4 mg via INTRAVENOUS

## 2019-11-24 MED ORDER — KETOROLAC TROMETHAMINE 30 MG/ML IJ SOLN: 30.0000 mg | Freq: Four times a day (QID) | INTRAMUSCULAR | Status: DC

## 2019-11-24 MED ORDER — ONDANSETRON HCL 4 MG/2ML IJ SOLN
INTRAMUSCULAR | Status: AC
  Filled 2019-11-24: qty 2

## 2019-11-24 MED ORDER — EPHEDRINE 5 MG/ML INJ
INTRAVENOUS | Status: AC
  Filled 2019-11-24: qty 10

## 2019-11-24 MED ORDER — TETANUS-DIPHTH-ACELL PERTUSSIS 5-2.5-18.5 LF-MCG/0.5 IM SUSP: 0.5000 mL | Freq: Once | INTRAMUSCULAR | Status: DC

## 2019-11-24 MED ORDER — MENTHOL 3 MG MT LOZG
1.0000 | LOZENGE | OROMUCOSAL | Status: DC | PRN
  Filled 2019-11-24: qty 9

## 2019-11-24 MED ORDER — SODIUM CHLORIDE (PF) 0.9 % IJ SOLN
INTRAMUSCULAR | Status: AC
  Filled 2019-11-24: qty 50

## 2019-11-24 MED ORDER — IBUPROFEN 800 MG PO TABS
800.0000 mg | ORAL_TABLET | Freq: Four times a day (QID) | ORAL | Status: DC
  Administered 2019-11-25 – 2019-11-26 (×5): 800 mg via ORAL
  Filled 2019-11-24 (×5): qty 1

## 2019-11-24 MED ORDER — NALBUPHINE HCL 10 MG/ML IJ SOLN
5.0000 mg | Freq: Once | INTRAMUSCULAR | Status: AC | PRN
  Administered 2019-11-24: 5 mg via INTRAVENOUS

## 2019-11-24 MED ORDER — LACTATED RINGERS IV SOLN: INTRAVENOUS | Status: DC

## 2019-11-24 MED ORDER — METHYLERGONOVINE MALEATE 0.2 MG/ML IJ SOLN
INTRAMUSCULAR | Status: AC
  Filled 2019-11-24: qty 1

## 2019-11-24 MED ORDER — BUPIVACAINE LIPOSOME 1.3 % IJ SUSP
INTRAMUSCULAR | Status: AC
  Filled 2019-11-24: qty 20

## 2019-11-24 MED ORDER — ORAL CARE MOUTH RINSE: 15.0000 mL | Freq: Once | OROMUCOSAL | Status: DC

## 2019-11-24 MED ORDER — FENTANYL CITRATE (PF) 100 MCG/2ML IJ SOLN
INTRAMUSCULAR | Status: AC
  Filled 2019-11-24: qty 2

## 2019-11-24 MED ORDER — SIMETHICONE 80 MG PO CHEW: 80.0000 mg | CHEWABLE_TABLET | ORAL | Status: DC | PRN

## 2019-11-24 MED ORDER — NALBUPHINE HCL 10 MG/ML IJ SOLN
5.0000 mg | INTRAMUSCULAR | Status: DC | PRN
  Filled 2019-11-24: qty 1

## 2019-11-24 MED ORDER — BUPIVACAINE HCL (PF) 0.5 % IJ SOLN
INTRAMUSCULAR | Status: AC
  Filled 2019-11-24: qty 30

## 2019-11-24 MED ORDER — SENNOSIDES-DOCUSATE SODIUM 8.6-50 MG PO TABS
2.0000 | ORAL_TABLET | ORAL | Status: DC
  Administered 2019-11-24 – 2019-11-25 (×2): 2 via ORAL
  Filled 2019-11-24 (×2): qty 2

## 2019-11-24 MED ORDER — SODIUM CHLORIDE FLUSH 0.9 % IV SOLN
INTRAVENOUS | Status: DC | PRN
  Administered 2019-11-24: 100 mL

## 2019-11-24 MED ORDER — WITCH HAZEL-GLYCERIN EX PADS: 1.0000 "application " | MEDICATED_PAD | CUTANEOUS | Status: DC | PRN

## 2019-11-24 MED ORDER — KETOROLAC TROMETHAMINE 30 MG/ML IJ SOLN
INTRAMUSCULAR | Status: AC
  Filled 2019-11-24: qty 1

## 2019-11-24 MED ORDER — EPHEDRINE SULFATE-NACL 50-0.9 MG/10ML-% IV SOSY
PREFILLED_SYRINGE | INTRAVENOUS | Status: DC | PRN
  Administered 2019-11-24: 5 mg via INTRAVENOUS
  Administered 2019-11-24: 10 mg via INTRAVENOUS

## 2019-11-24 MED ORDER — NALBUPHINE HCL 10 MG/ML IJ SOLN: 5.0000 mg | Freq: Once | INTRAMUSCULAR | Status: AC | PRN

## 2019-11-24 MED ORDER — ACETAMINOPHEN 500 MG PO TABS: 1000.0000 mg | ORAL_TABLET | Freq: Four times a day (QID) | ORAL | Status: DC

## 2019-11-24 MED ORDER — SODIUM CHLORIDE 0.9 % IV SOLN
INTRAVENOUS | Status: DC | PRN
  Administered 2019-11-24: 30 ug/min via INTRAVENOUS

## 2019-11-24 MED ORDER — DIPHENHYDRAMINE HCL 25 MG PO CAPS: 25.0000 mg | ORAL_CAPSULE | ORAL | Status: DC | PRN

## 2019-11-24 MED ORDER — NALOXONE HCL 4 MG/10ML IJ SOLN
1.0000 ug/kg/h | INTRAVENOUS | Status: DC | PRN
  Filled 2019-11-24: qty 5

## 2019-11-24 MED ORDER — SIMETHICONE 80 MG PO CHEW
80.0000 mg | CHEWABLE_TABLET | ORAL | Status: DC
  Administered 2019-11-24 – 2019-11-25 (×2): 80 mg via ORAL
  Filled 2019-11-24 (×2): qty 1

## 2019-11-24 MED ORDER — DIPHENHYDRAMINE HCL 50 MG/ML IJ SOLN: 12.5000 mg | INTRAMUSCULAR | Status: DC | PRN

## 2019-11-24 MED ORDER — NALOXONE HCL 0.4 MG/ML IJ SOLN: 0.4000 mg | INTRAMUSCULAR | Status: DC | PRN

## 2019-11-24 MED ORDER — ACETAMINOPHEN 325 MG PO TABS
ORAL_TABLET | ORAL | Status: AC
  Filled 2019-11-24: qty 2

## 2019-11-24 MED ORDER — BUPIVACAINE IN DEXTROSE 0.75-8.25 % IT SOLN
INTRATHECAL | Status: DC | PRN
  Administered 2019-11-24: 1.6 mL via INTRATHECAL

## 2019-11-24 MED ORDER — DIPHENHYDRAMINE HCL 25 MG PO CAPS: 25.0000 mg | ORAL_CAPSULE | Freq: Four times a day (QID) | ORAL | Status: DC | PRN

## 2019-11-24 MED ORDER — HEMOSTATIC AGENTS (NO CHARGE) OPTIME
TOPICAL | Status: DC | PRN
  Administered 2019-11-24: 1 via TOPICAL

## 2019-11-24 MED ORDER — GABAPENTIN 300 MG PO CAPS
300.0000 mg | ORAL_CAPSULE | Freq: Every day | ORAL | Status: DC
  Administered 2019-11-24 – 2019-11-25 (×2): 300 mg via ORAL
  Filled 2019-11-24 (×2): qty 1

## 2019-11-24 MED ORDER — DIBUCAINE (PERIANAL) 1 % EX OINT: 1.0000 "application " | TOPICAL_OINTMENT | CUTANEOUS | Status: DC | PRN

## 2019-11-24 MED ORDER — MORPHINE SULFATE (PF) 2 MG/ML IV SOLN
1.0000 mg | INTRAVENOUS | Status: DC | PRN
  Administered 2019-11-24: 2 mg via INTRAVENOUS
  Filled 2019-11-24: qty 1

## 2019-11-24 MED ORDER — MORPHINE SULFATE (PF) 0.5 MG/ML IJ SOLN
INTRAMUSCULAR | Status: AC
  Filled 2019-11-24: qty 10

## 2019-11-24 MED ORDER — NALBUPHINE HCL 10 MG/ML IJ SOLN: 5.0000 mg | INTRAMUSCULAR | Status: DC | PRN

## 2019-11-24 MED ORDER — KETOROLAC TROMETHAMINE 30 MG/ML IJ SOLN
INTRAMUSCULAR | Status: DC | PRN
  Administered 2019-11-24: 30 mg via INTRAVENOUS

## 2019-11-24 MED ORDER — CARBOPROST TROMETHAMINE 250 MCG/ML IM SOLN
INTRAMUSCULAR | Status: AC
  Filled 2019-11-24: qty 1

## 2019-11-24 MED ORDER — PHENYLEPHRINE HCL (PRESSORS) 10 MG/ML IV SOLN
INTRAVENOUS | Status: DC | PRN
  Administered 2019-11-24 (×2): 50 ug via INTRAVENOUS

## 2019-11-24 MED ORDER — SIMETHICONE 80 MG PO CHEW
80.0000 mg | CHEWABLE_TABLET | Freq: Three times a day (TID) | ORAL | Status: DC
  Administered 2019-11-25 (×3): 80 mg via ORAL
  Filled 2019-11-24 (×3): qty 1

## 2019-11-24 MED ORDER — OXYTOCIN-SODIUM CHLORIDE 30-0.9 UT/500ML-% IV SOLN
2.5000 [IU]/h | INTRAVENOUS | Status: DC
  Administered 2019-11-24: 2.5 [IU]/h via INTRAVENOUS

## 2019-11-24 MED ORDER — ENOXAPARIN SODIUM 40 MG/0.4ML ~~LOC~~ SOLN
40.0000 mg | SUBCUTANEOUS | Status: DC
  Administered 2019-11-25 – 2019-11-26 (×2): 40 mg via SUBCUTANEOUS
  Filled 2019-11-24 (×2): qty 0.4

## 2019-11-24 MED ORDER — ONDANSETRON HCL 4 MG/2ML IJ SOLN
INTRAMUSCULAR | Status: DC | PRN
  Administered 2019-11-24: 4 mg via INTRAVENOUS

## 2019-11-24 MED ORDER — FENTANYL CITRATE (PF) 100 MCG/2ML IJ SOLN
INTRAMUSCULAR | Status: DC | PRN
  Administered 2019-11-24: 15 ug via INTRAVENOUS
  Administered 2019-11-24: 50 ug via INTRAVENOUS
  Administered 2019-11-24: 35 ug via INTRAVENOUS

## 2019-11-24 MED ORDER — ACETAMINOPHEN 325 MG PO TABS
650.0000 mg | ORAL_TABLET | Freq: Four times a day (QID) | ORAL | Status: AC
  Administered 2019-11-24 – 2019-11-25 (×3): 650 mg via ORAL
  Filled 2019-11-24 (×2): qty 2

## 2019-11-24 MED ORDER — OXYTOCIN-SODIUM CHLORIDE 30-0.9 UT/500ML-% IV SOLN
INTRAVENOUS | Status: DC | PRN
  Administered 2019-11-24: 250 mL/h via INTRAVENOUS

## 2019-11-24 MED ORDER — OXYCODONE HCL 5 MG PO TABS
5.0000 mg | ORAL_TABLET | ORAL | Status: DC | PRN
  Administered 2019-11-25: 10 mg via ORAL
  Administered 2019-11-25: 5 mg via ORAL
  Administered 2019-11-25: 10 mg via ORAL
  Administered 2019-11-25: 5 mg via ORAL
  Administered 2019-11-26 (×2): 10 mg via ORAL
  Filled 2019-11-24: qty 1
  Filled 2019-11-24 (×2): qty 2
  Filled 2019-11-24: qty 1
  Filled 2019-11-24 (×2): qty 2

## 2019-11-24 MED ORDER — OXYCODONE HCL 5 MG PO TABS: 5.0000 mg | ORAL_TABLET | ORAL | Status: DC | PRN

## 2019-11-24 MED ORDER — KETOROLAC TROMETHAMINE 30 MG/ML IJ SOLN
30.0000 mg | Freq: Four times a day (QID) | INTRAMUSCULAR | Status: AC
  Administered 2019-11-24 – 2019-11-25 (×3): 30 mg via INTRAVENOUS
  Filled 2019-11-24 (×3): qty 1

## 2019-11-24 MED ORDER — MORPHINE SULFATE (PF) 0.5 MG/ML IJ SOLN
INTRAMUSCULAR | Status: DC | PRN
  Administered 2019-11-24: 100 ug via EPIDURAL

## 2019-11-24 MED ORDER — COCONUT OIL OIL
1.0000 "application " | TOPICAL_OIL | Status: DC | PRN
  Filled 2019-11-24: qty 120

## 2019-11-24 MED ORDER — PRENATAL MULTIVITAMIN CH
1.0000 | ORAL_TABLET | Freq: Every day | ORAL | Status: DC
  Administered 2019-11-25 – 2019-11-26 (×2): 1 via ORAL
  Filled 2019-11-24 (×2): qty 1

## 2019-11-24 MED ORDER — OXYTOCIN-SODIUM CHLORIDE 30-0.9 UT/500ML-% IV SOLN
INTRAVENOUS | Status: AC
  Filled 2019-11-24: qty 1000

## 2019-11-24 MED ORDER — MISOPROSTOL 200 MCG PO TABS
ORAL_TABLET | ORAL | Status: AC
  Filled 2019-11-24: qty 5

## 2019-11-24 SURGICAL SUPPLY — 34 items
BARRIER ADHS 3X4 INTERCEED (GAUZE/BANDAGES/DRESSINGS) ×3 IMPLANT
CANISTER SUCT 3000ML PPV (MISCELLANEOUS) ×3 IMPLANT
CHLORAPREP W/TINT 26 (MISCELLANEOUS) ×3 IMPLANT
COVER WAND RF STERILE (DRAPES) ×3 IMPLANT
DRSG TELFA 3X8 NADH (GAUZE/BANDAGES/DRESSINGS) ×3 IMPLANT
ELECT CAUTERY BLADE 6.4 (BLADE) ×3 IMPLANT
ELECT REM PT RETURN 9FT ADLT (ELECTROSURGICAL) ×3
ELECTRODE REM PT RTRN 9FT ADLT (ELECTROSURGICAL) ×1 IMPLANT
GAUZE SPONGE 4X4 12PLY STRL (GAUZE/BANDAGES/DRESSINGS) ×3 IMPLANT
GLOVE SURG SYN 8.0 (GLOVE) ×3 IMPLANT
GLOVE SURG SYN 8.0 PF PI (GLOVE) ×1 IMPLANT
GOWN STRL REUS W/ TWL LRG LVL3 (GOWN DISPOSABLE) ×2 IMPLANT
GOWN STRL REUS W/ TWL XL LVL3 (GOWN DISPOSABLE) ×1 IMPLANT
GOWN STRL REUS W/TWL LRG LVL3 (GOWN DISPOSABLE) ×4
GOWN STRL REUS W/TWL XL LVL3 (GOWN DISPOSABLE) ×2
HANDLE YANKAUER SUCT BULB TIP (MISCELLANEOUS) ×2 IMPLANT
NS IRRIG 1000ML POUR BTL (IV SOLUTION) ×3 IMPLANT
PACK C SECTION (MISCELLANEOUS) ×3 IMPLANT
PAD DRESSING TELFA 3X8 NADH (GAUZE/BANDAGES/DRESSINGS) ×1 IMPLANT
PAD OB MATERNITY 4.3X12.25 (PERSONAL CARE ITEMS) ×3 IMPLANT
PAD PREP 24X41 OB/GYN DISP (PERSONAL CARE ITEMS) ×3 IMPLANT
PENCIL SMOKE ULTRAEVAC 22 CON (MISCELLANEOUS) ×3 IMPLANT
RETRACTOR TRAXI PANNICULUS (MISCELLANEOUS) IMPLANT
SPONGE GAUZE 4X4 12PLY (GAUZE/BANDAGES/DRESSINGS) ×2 IMPLANT
STAPLER INSORB 30 2030 C-SECTI (MISCELLANEOUS) IMPLANT
STRAP SAFETY 5IN WIDE (MISCELLANEOUS) ×3 IMPLANT
SUCT VACUUM KIWI BELL (SUCTIONS) IMPLANT
SUT CHROMIC 1 CTX 36 (SUTURE) ×9 IMPLANT
SUT PLAIN GUT 0 (SUTURE) ×6 IMPLANT
SUT VIC AB 0 CT1 36 (SUTURE) ×6 IMPLANT
TAPE PAPER MEDFIX 1IN X 10YD (GAUZE/BANDAGES/DRESSINGS) ×2 IMPLANT
TRAXI PANNICULUS RETRACTOR (MISCELLANEOUS)
TUBING CONNECTING 10 (TUBING) ×1 IMPLANT
TUBING CONNECTING 10' (TUBING) ×1

## 2019-11-24 NOTE — Anesthesia Procedure Notes (Signed)
Spinal  Patient location during procedure: OR Start time: 11/24/2019 10:19 AM End time: 11/24/2019 10:24 AM Staffing Performed: resident/CRNA  Anesthesiologist: Alver Fisher, MD Resident/CRNA: Elmarie Mainland, CRNA Preanesthetic Checklist Completed: patient identified, IV checked, site marked, risks and benefits discussed, surgical consent, monitors and equipment checked, pre-op evaluation and timeout performed Spinal Block Patient position: sitting Prep: ChloraPrep Patient monitoring: heart rate, continuous pulse ox and blood pressure Approach: midline Location: L3-4 Injection technique: single-shot Needle Needle type: Introducer and Pencil-Tip  Needle gauge: 24 G Needle length: 9 cm Assessment Sensory level: T4

## 2019-11-24 NOTE — Brief Op Note (Signed)
11/24/2019  11:13 AM  PATIENT:  Tara Strickland  29 y.o. female  PRE-OPERATIVE DIAGNOSIS:  elective repeat, sterilization  POST-OPERATIVE DIAGNOSIS:  elective repeat, sterilization  PROCEDURE:  Procedure(s): REPEAT CESAREAN SECTION WITH BILATERAL TUBAL LIGATION (Bilateral)  SURGEON:  Surgeon(s) and Role:    * Otilio Groleau, Ihor Austin, MD - Primary  PHYSICIAN ASSISTANT: Margaretmary Eddy , CNM   ASSISTANTS: none   ANESTHESIA:   spinal  EBL:  165 mL   BLOOD ADMINISTERED:none  DRAINS: Urinary Catheter (Foley)   LOCAL MEDICATIONS USED:  MARCAINE    and BUPIVICAINE   SPECIMEN:  Source of Specimen:  portion bialateral fallopian tubes  DISPOSITION OF SPECIMEN:  PATHOLOGY  COUNTS:  YES  TOURNIQUET:  * No tourniquets in log *  DICTATION: .Other Dictation: Dictation Number verbal  PLAN OF CARE: Admit to inpatient   PATIENT DISPOSITION:  PACU - hemodynamically stable.   Delay start of Pharmacological VTE agent (>24hrs) due to surgical blood loss or risk of bleeding: not applicable

## 2019-11-24 NOTE — Discharge Summary (Signed)
Obstetrical Discharge Summary  Patient Name: Tara Strickland DOB: [birth year] MRN: [id number]  Date of Admission: 11/24/2019 Date of Delivery: 11/24/2019 Delivered by: Beverly Gust MD Date of Discharge: 11/26/2019  Primary OB: [facility]  LMP:No LMP recorded. EDC Estimated Date of Delivery: 11/26/19 Gestational Age at Delivery: [redacted]w[redacted]d   Antepartum complications:prior c/s  Admitting Diagnosis: elective repeat and sterilization  Secondary Diagnosis: Patient Active Problem List   Diagnosis Date Noted  . Previous cesarean delivery affecting pregnancy 11/24/2019  . Postoperative state 11/24/2019  . Indication for care in labor and delivery, antepartum 02/06/2018  . Indication for care in labor or delivery 02/06/2018  . S/P cesarean section 01/29/2015  . Normal labor and delivery 01/27/2015  . Encounter for routine screening for malformation using ultrasonics   . [redacted] weeks gestation of pregnancy   . Chlamydial cervicitis 09/02/2014  . Bacterial vaginosis 09/02/2014  . Normal first pregnancy confirmed, antepartum 08/04/2014    Augmentation: n/a Complications: None Intrapartum complications/course: see OP note Date of Delivery: 11/24/2019 Delivered By: Beverly Gust MD Delivery Type: repeat cesarean section, low transverse incision Anesthesia:spinal  Placenta:manual Laceration:  Episiotomy: none Newborn Data: Live born child female  Birth Weight: 3400 gm   APGAR: , 8/9   Newborn Delivery   Birth date/time:  Delivery type:         Postpartum Procedures: none  Edinburgh:  Edinburgh Postnatal Depression Scale Screening Tool 11/25/2019 02/07/2018  I have been able to laugh and see the funny side of things. 0 0  I have looked forward with enjoyment to things. 0 0  I have blamed myself unnecessarily when things went wrong. 0 0  I have been anxious or worried for no good reason. 0 0  I have felt scared or panicky for no good reason. 0 0  Things have been getting on top  of me. 1 0  I have been so unhappy that I have had difficulty sleeping. 0 0  I have felt sad or miserable. 0 0  I have been so unhappy that I have been crying. 1 0  The thought of harming myself has occurred to me. 0 0  Edinburgh Postnatal Depression Scale Total 2 0     Cesarean Section:  Patient had an uncomplicated postpartum course.  By time of discharge on POD#2, her pain was controlled on oral pain medications; she had appropriate lochia and was ambulating, voiding without difficulty, tolerating regular diet and passing flatus.   She was deemed stable for discharge to home.    Discharge Physical Exam:  BP (!) 107/59 (BP Location: Left Arm) Comment: nurse S. Stone notified  Pulse 74   Temp 98 F (36.7 C) (Oral)   Resp 18   Ht 5' 3.5" (1.613 m)   Wt 94.8 kg   SpO2 99%   Breastfeeding Unknown   BMI 36.44 kg/m   General: NAD CV: RRR Pulm: nl effort ABD: s/nd/nt, fundus firm and below the umbilicus Lochia: moderate Incision: c/d/i DVT Evaluation: LE non-ttp, no evidence of DVT on exam.  Hemoglobin  Date Value Ref Range Status  11/25/2019 10.4 (L) 12.0 - 15.0 g/dL Final   HCT  Date Value Ref Range Status  11/25/2019 31.0 (L) 36.0 - 46.0 % Final     Disposition: stable, discharge to home. Baby Feeding: breastmilk Baby Disposition: home with mom  Rh Immune globulin given: n/a Rubella vaccine given: n/a Tdap vaccine given in AP or PP setting: given 08/27/19 Flu vaccine given in AP or PP  setting: declined  Contraception: BTL done  Prenatal Labs:  ABO, Rh:  B+ Antibody:  negative Rubella:  Imm , VAricella Imm  RPR:   NR HBsAg:   neg HIV:   NEg  GBS:   Positive on UA culture    Plan:  Tara Strickland was discharged to home in good condition. Follow-up appointment with delivering provider in 6 weeks.  Discharge Medications: Allergies as of 11/26/2019      Reactions   Orange Oil Hives   Hives on arms, face and neck   Tomato Hives   Hives on arms and neck  area.      Medication List    STOP taking these medications   acetaminophen 500 MG tablet Commonly known as: TYLENOL   OB Complete Petite 35-5-1-200 MG Caps   senna-docusate 8.6-50 MG tablet Commonly known as: Senokot-S     TAKE these medications   docusate sodium 100 MG capsule Commonly known as: Colace Take 1 capsule (100 mg total) by mouth 2 (two) times daily.   ibuprofen 800 MG tablet Commonly known as: ADVIL Take 1 tablet (800 mg total) by mouth every 8 (eight) hours as needed for mild pain, moderate pain or cramping. What changed:   medication strength  how much to take  when to take this  reasons to take this   oxyCODONE 5 MG immediate release tablet Commonly known as: Oxy IR/ROXICODONE Take 1-2 tablets (5-10 mg total) by mouth every 4 (four) hours as needed for moderate pain. What changed:   how much to take  reasons to take this   Prenatal Vitamins 28-0.8 MG Tabs Take 1 tablet by mouth daily.       Follow-up Information    Schermerhorn, Gwen Her, MD. Schedule an appointment as soon as possible for a visit in 1 week(s).   Specialty: Obstetrics and Gynecology Contact information: [address] [place] [address] [phone]           Signed: Clydene Laming 11/26/2019 11:14 AM

## 2019-11-24 NOTE — Op Note (Signed)
Tara Strickland, [place] MED[facility] RECORD WN:[id number] ACCOUNT [id number] DATE OF BIRTH:[birth year] FACILITY: [facility] LOCATION: [facility] PHYSICIAN:Marilyne Haseley Cloyde Reams, MD  OPERATIVE REPORT  DATE OF PROCEDURE:  11/24/2019  PREOPERATIVE DIAGNOSES: 1.  39+5 weeks estimated gestational age. 2.  Previous cesarean section elects repeat cesarean section. 3.  Elective sterilization.  POSTOPERATIVE DIAGNOSES:   1.  39+5 weeks estimated gestational age. 2.  Previous cesarean section elects repeat cesarean section. 3.  Elective sterilization.  PROCEDURE:   1.  Repeat low transverse cesarean section. 2.  Bilateral tubal ligation, [place].  ANESTHESIA:  Spinal.  SURGEON:  Jennell Corner, MD  FIRST ASSISTANT:  Kathrin Ruddy, certified nurse midwife  INDICATIONS:  A 29 year old gravida 3, para 2 patient with 2 prior cesarean sections and has elected for a repeat cesarean section and permanent sterilization. [facility] 11/26/19  DESCRIPTION OF PROCEDURE:  After adequate spinal anesthesia, the patient was placed in the dorsal supine position, hip roll under the right side.  The patient's abdomen was prepped and draped in normal sterile fashion.  Timeout was performed.  The  patient did receive 2 grams IV Ancef prior to commencement of the case.  A Pfannenstiel incision was made 2 fingerbreadths above the symphysis pubis.  Sharp dissection was used to identify the fascia, which was scored in the midline and opened in a  transverse fashion.  Superior aspect of the fascia was grasped with Kocher clamps and the recti muscles were dissected free.  Inferior aspect of the fascia was grasped with Kocher clamps.  Pyramidalis muscle was dissected free.  Entry into the peritoneal  cavity was accomplished sharply.  The vesicouterine peritoneal fold was identified and a bladder flap was created and the bladder was reflected inferiorly.  Low transverse uterine incision was made.  Upon entry into the endometrial cavity,  clear fluid  resulted.  Incision was extended with blunt transverse traction.  Fetal head was then brought to the incision with fundal pressure.  Head was delivered followed by shoulders and arms.  A vigorous female was then dried on the abdomen for 60 seconds and  her cord was clamped and vigorous female was passed to nursery staff who assigned Apgar scores of 8 and 9, fetal weight 3400 grams.  Time of birth 68 on 11/24/2019.  Placenta was manually delivered and the uterus was exteriorized.  Endometrial cavity  was wiped clean with laparotomy tape and the cervix was opened with ring forceps.  Uterine incision was closed with a running 1 chromic suture in a locking fashion.  Good hemostasis was noted.  Attention was directed to the patient's right fallopian  tube, which was grasped at the midportion and 2 separate 0 plain gut sutures were applied and a 1.5 cm portion of fallopian tube was removed.  Good hemostasis was noted.  Similar procedure was repeated on the patient's left fallopian tube after grasping  in the midportion.  Two separate 0 plain gut sutures were placed and a 1.5 cm portion of fallopian tube was removed.  Good hemostasis noted.  Posterior cul-de-sac was irrigated and suctioned.  Uterus was placed back in the abdominal cavity and the  pericolic gutters were wiped clean with laparotomy tape.  Uterine incision appeared hemostatic as well as the tubal ligation sites.  Interceed was placed over the uterine incision in a T-shaped fashion.  Fascia was then closed with a running nonlocking  fashion, 0 Vicryl suture.  Subfascial tissues were then injected with a solution of 20 mL of 1.3% Exparel plus 30 mL  of 0.5% Marcaine plus 50 mL normal saline.  Subcutaneous tissues were irrigated and bovied for hemostasis and the skin was reapproximated  with Insorb absorbable staples.  Good cosmetic effect.  COMPLICATIONS:  There were no complications.  ESTIMATED BLOOD LOSS:  165 mL.  INTRAOPERATIVE  FLUIDS:  500 mL.  URINE OUTPUT:  100 mL.  DISPOSITION:  The patient tolerated the procedure well and was taken to recovery room in good condition.  CN/NUANCE  D:11/24/2019 T:11/24/2019 JOB:[id number]

## 2019-11-24 NOTE — Progress Notes (Signed)
Labs reviewed , pt interviewed . Repeat LTCS and BTL ( reconfirmed )  Proceed

## 2019-11-24 NOTE — Transfer of Care (Signed)
Immediate Anesthesia Transfer of Care Note  Patient: Tara Strickland  Procedure(s) Performed: REPEAT CESAREAN SECTION WITH BILATERAL TUBAL LIGATION (Bilateral )  Patient Location: PACU and Mother/Baby  Anesthesia Type:Spinal  Level of Consciousness: awake, alert  and oriented  Airway & Oxygen Therapy: Patient Spontanous Breathing  Post-op Assessment: Report given to RN and Post -op Vital signs reviewed and stable  Post vital signs: Reviewed and stable  Last Vitals:  Vitals Value Taken Time  BP 97/60 11/24/19 1125  Temp    Pulse 76 11/24/19 1125  Resp    SpO2 95 % 11/24/19 1125    Last Pain:  Vitals:   11/24/19 0835  TempSrc: Oral  PainSc: 0-No pain         Complications: No apparent anesthesia complications

## 2019-11-24 NOTE — Anesthesia Preprocedure Evaluation (Signed)
Anesthesia Evaluation  Patient identified by MRN, date of birth, ID band Patient awake    Reviewed: Allergy & Precautions, NPO status , Patient's Chart, lab work & pertinent test results  History of Anesthesia Complications Negative for: history of anesthetic complications  Airway Mallampati: II  TM Distance: >3 FB Neck ROM: Full    Dental no notable dental hx.    Pulmonary neg pulmonary ROS, neg sleep apnea, neg COPD,    breath sounds clear to auscultation- rhonchi (-) wheezing      Cardiovascular Exercise Tolerance: Good (-) hypertension(-) CAD and (-) Past MI  Rhythm:Regular Rate:Normal - Systolic murmurs and - Diastolic murmurs    Neuro/Psych negative neurological ROS  negative psych ROS   GI/Hepatic Neg liver ROS, GERD  ,  Endo/Other  negative endocrine ROSneg diabetes  Renal/GU negative Renal ROS     Musculoskeletal negative musculoskeletal ROS (+)   Abdominal (+) + obese,   Peds  Hematology negative hematology ROS (+)   Anesthesia Other Findings   Reproductive/Obstetrics (+) Pregnancy                             Lab Results  Component Value Date   WBC 9.6 11/21/2019   HGB 11.7 (L) 11/21/2019   HCT 34.9 (L) 11/21/2019   MCV 89.0 11/21/2019   PLT 277 11/21/2019    Anesthesia Physical Anesthesia Plan  ASA: II  Anesthesia Plan: Spinal   Post-op Pain Management:    Induction:   PONV Risk Score and Plan: 2 and Ondansetron  Airway Management Planned: Natural Airway  Additional Equipment:   Intra-op Plan:   Post-operative Plan:   Informed Consent: I have reviewed the patients History and Physical, chart, labs and discussed the procedure including the risks, benefits and alternatives for the proposed anesthesia with the patient or authorized representative who has indicated his/her understanding and acceptance.     Dental advisory given  Plan Discussed with: CRNA  and Anesthesiologist  Anesthesia Plan Comments:         Anesthesia Quick Evaluation

## 2019-11-24 NOTE — Lactation Note (Addendum)
This note was copied from a baby's chart. Lactation Consultation Note  Patient Name: Girl Ilda Laskin [id number] Date: 11/24/2019 Reason for consult: Follow-up assessment;Mother's request;Term;Other (Comment)(Mom needed help with BF d/t C/S & nausea)  Delorise Shiner has been breast feeding well since first breast feed.  Mom concerned that Delorise Shiner had gone over 3 hours and was not waking for a feeding.  Explained to mom that was normal on first day of life and encouraged her to watch for feeding cues and put her to the breast whenever she demonstrated hunger cues.  She was starting to squirm.  Changed first bowel movement.  Hand expressed drops of colostrum to entice her to latch.  Mom has large breasts.  Demonstrated how to sandwich breast for deep latch.  After a couple of attempts, she latched and began strong rhythmic sucking with occasional swallows.  Mom was getting sleepy towards the end of the feeding and was not holding her close to the breast, letting her get a shallow latch.  Once she came off the breast after about 10 to 12 minutes of sucking, she refused to relatch and was very sleepy.  Mom denies any breast or nipple pain and no trauma noted and nipple was not misshaped.  Mom was still interested in getting coconut oil.  Instructions given on use.  Mom breast fed her 54 yr old and 2 yr old for 13 months and 5 to 6 months.  The only challenges she had was with the first baby in the beginning to get her to latch.  Reviewed normal newborn stomach size, supply and demand, adequate infant intake and output, normal course of lactation and routine newborn feeding patterns.  Lactation community resource hand out given with contact numbers and reviewed.  Lactation names and number written on white board and encouraged to call with any questions, concerns or assistance. Maternal Data Formula Feeding for Exclusion: No Has patient been taught Hand Expression?: Yes Does the patient have breastfeeding experience prior  to this delivery?: Yes  Feeding Feeding Type: Breast Fed  LATCH Score Latch: Repeated attempts needed to sustain latch, nipple held in mouth throughout feeding, stimulation needed to elicit sucking reflex.  Audible Swallowing: A few with stimulation  Type of Nipple: Everted at rest and after stimulation  Comfort (Breast/Nipple): Soft / non-tender  Hold (Positioning): Assistance needed to correctly position infant at breast and maintain latch.  LATCH Score: 7  Interventions Interventions: Breast feeding basics reviewed;Assisted with latch;Skin to skin;Breast massage;Hand express;Reverse pressure;Breast compression;Adjust position;Support pillows;Position options;Coconut oil  Lactation Tools Discussed/Used Tools: Coconut oil WIC Program: Yes   Consult Status Consult Status: Follow-up Follow-up type: Call as needed    Louis Meckel 11/24/2019, 9:19 PM

## 2019-11-25 LAB — CBC
HCT: 31 % — ABNORMAL LOW (ref 36.0–46.0)
Hemoglobin: 10.4 g/dL — ABNORMAL LOW (ref 12.0–15.0)
MCH: 29.5 pg (ref 26.0–34.0)
MCHC: 33.5 g/dL (ref 30.0–36.0)
MCV: 88.1 fL (ref 80.0–100.0)
Platelets: 254 10*3/uL (ref 150–400)
RBC: 3.52 MIL/uL — ABNORMAL LOW (ref 3.87–5.11)
RDW: 14.2 % (ref 11.5–15.5)
WBC: 11.2 10*3/uL — ABNORMAL HIGH (ref 4.0–10.5)
nRBC: 0 % (ref 0.0–0.2)

## 2019-11-25 LAB — SURGICAL PATHOLOGY

## 2019-11-25 NOTE — Progress Notes (Signed)
Encouraged patient to ambulate in the hall. Patient states she just went to the bathroom and is in pain. Patient wants to "wait until my pain medicine kicks in." RN will reassess.

## 2019-11-25 NOTE — Anesthesia Post-op Follow-up Note (Signed)
  Anesthesia Pain Follow-up Note  Patient: Tara Strickland  Day #: 1  Date of Follow-up: 11/25/2019 Time: 10:27 AM  Last Vitals:  Vitals:   11/25/19 0339 11/25/19 0815  BP: 112/60 107/60  Pulse: 60 80  Resp: 18 20  Temp: 36.7 C 36.7 C  SpO2: 98% 96%    Level of Consciousness: alert  Pain: none   Side Effects:None  Catheter Site Exam:clean, dry, no drainage  Anti-Coag Meds (From admission, onward)   Start     Dose/Rate Route Frequency Ordered Stop   11/25/19 1000  enoxaparin (LOVENOX) injection 40 mg     40 mg Subcutaneous Every 24 hours 11/24/19 1646         Plan: D/C from anesthesia care at surgeon's request  Siriah Treat B Alonza Smoker

## 2019-11-25 NOTE — Progress Notes (Signed)
Patient to have current incision dressing removed and replaced with honeycomb dressing. Patient may shower first, per CNM R. McVey. Patient updated. Patient wants to wait until her husband comes back to shower. Patient to alert RN after her shower for the dressing change.

## 2019-11-25 NOTE — Anesthesia Postprocedure Evaluation (Signed)
Anesthesia Post Note  Patient: Tara Strickland  Procedure(s) Performed: REPEAT CESAREAN SECTION WITH BILATERAL TUBAL LIGATION (Bilateral )  Patient location during evaluation: Mother Baby Anesthesia Type: Spinal Level of consciousness: oriented and awake and alert Pain management: pain level controlled Vital Signs Assessment: post-procedure vital signs reviewed and stable Respiratory status: spontaneous breathing and respiratory function stable Cardiovascular status: blood pressure returned to baseline and stable Postop Assessment: no headache, no backache, no apparent nausea or vomiting and able to ambulate Anesthetic complications: no     Last Vitals:  Vitals:   11/25/19 0339 11/25/19 0815  BP: 112/60 107/60  Pulse: 60 80  Resp: 18 20  Temp: 36.7 C 36.7 C  SpO2: 98% 96%    Last Pain:  Vitals:   11/25/19 0930  TempSrc:   PainSc: 5                  Kionna Brier B Alonza Smoker

## 2019-11-25 NOTE — Progress Notes (Signed)
Post Partum Day 1 Subjective: Doing well, no complaints.  Tolerating regular diet, pain with PO meds, voiding and ambulating without difficulty.  No CP SOB Fever,Chills, N/V or leg pain; denies nipple or breast pain, no HA change of vision, RUQ/epigastric pain  Objective: BP 107/60 (BP Location: Left Arm)   Pulse 80   Temp 98 F (36.7 C) (Oral)   Resp 20   Ht 5' 3.5" (1.613 m)   Wt 94.8 kg   SpO2 96%   Breastfeeding Unknown   BMI 36.44 kg/m    Physical Exam:  General: NAD Breasts: soft/nontender CV: RRR Pulm: nl effort, CTABL Abdomen: soft, NT, BS x 4 Incision: Dsg CDI- dark red drainage noted on dressing, no erythema surrounding.  Lochia: small Uterine Fundus: fundus firm and 2 fb below umbilicus DVT Evaluation: no cords, ttp LEs   Recent Labs    11/25/19 0526  HGB 10.4*  HCT 31.0*  WBC 11.2*  PLT 254    Assessment/Plan: 29 y.o. G3P3003 postpartum day # 1  - Continue routine PP care - Lactation consult prn.  - BTL done  - Acute blood loss anemia - hemodynamically stable and asymptomatic; start po ferrous sulfate BID with stool softeners  - Immunization status: all Imms up to date    Disposition: Does not desire Dc home today.     Randa Ngo, CNM 11/25/2019  8:53 AM

## 2019-11-26 MED ORDER — DOCUSATE SODIUM 100 MG PO CAPS
100.0000 mg | ORAL_CAPSULE | Freq: Two times a day (BID) | ORAL | Status: AC
Start: 2019-11-26 — End: 2020-01-25

## 2019-11-26 MED ORDER — IBUPROFEN 800 MG PO TABS: 800.0000 mg | ORAL_TABLET | Freq: Three times a day (TID) | ORAL | 0 refills | Status: AC | PRN

## 2019-11-26 MED ORDER — OXYCODONE HCL 5 MG PO TABS: 5.0000 mg | ORAL_TABLET | ORAL | 0 refills | Status: AC | PRN

## 2019-11-26 NOTE — Progress Notes (Signed)
Patient discharged home with infant. Discharge instructions and prescriptions given and reviewed with patient. Patient verbalized understanding. Escorted out by auxillary.  

## 2019-11-26 NOTE — Discharge Instructions (Signed)
